# Patient Record
Sex: Female | Born: 1960 | Race: Black or African American | Hispanic: No | Marital: Single | Smoking: Current every day smoker
Health system: Southern US, Community
[De-identification: ages and names within clinical notes are randomized; demographics above are authoritative.]

## PROBLEM LIST (undated history)

## (undated) DIAGNOSIS — F32A Depression, unspecified: Secondary | ICD-10-CM

## (undated) DIAGNOSIS — R87619 Unspecified abnormal cytological findings in specimens from cervix uteri: Secondary | ICD-10-CM

## (undated) DIAGNOSIS — D219 Benign neoplasm of connective and other soft tissue, unspecified: Secondary | ICD-10-CM

## (undated) DIAGNOSIS — J45909 Unspecified asthma, uncomplicated: Secondary | ICD-10-CM

## (undated) DIAGNOSIS — F419 Anxiety disorder, unspecified: Secondary | ICD-10-CM

## (undated) DIAGNOSIS — M1990 Unspecified osteoarthritis, unspecified site: Secondary | ICD-10-CM

## (undated) DIAGNOSIS — A630 Anogenital (venereal) warts: Secondary | ICD-10-CM

## (undated) DIAGNOSIS — K219 Gastro-esophageal reflux disease without esophagitis: Secondary | ICD-10-CM

## (undated) DIAGNOSIS — A64 Unspecified sexually transmitted disease: Secondary | ICD-10-CM

## (undated) DIAGNOSIS — T7840XA Allergy, unspecified, initial encounter: Secondary | ICD-10-CM

## (undated) HISTORY — DX: Anogenital (venereal) warts: A63.0

## (undated) HISTORY — DX: Benign neoplasm of connective and other soft tissue, unspecified: D21.9

## (undated) HISTORY — DX: Unspecified osteoarthritis, unspecified site: M19.90

## (undated) HISTORY — DX: Unspecified sexually transmitted disease: A64

## (undated) HISTORY — DX: Depression, unspecified: F32.A

## (undated) HISTORY — DX: Anxiety disorder, unspecified: F41.9

## (undated) HISTORY — DX: Gastro-esophageal reflux disease without esophagitis: K21.9

## (undated) HISTORY — DX: Unspecified asthma, uncomplicated: J45.909

## (undated) HISTORY — PX: SALPINGECTOMY: SHX328

## (undated) HISTORY — DX: Unspecified abnormal cytological findings in specimens from cervix uteri: R87.619

## (undated) HISTORY — DX: Allergy, unspecified, initial encounter: T78.40XA

## (undated) HISTORY — PX: CERVIX LESION DESTRUCTION: SHX591

## (undated) HISTORY — PX: ABDOMINAL HYSTERECTOMY: SHX81

---

## 1997-10-14 ENCOUNTER — Emergency Department (HOSPITAL_COMMUNITY): Admission: EM | Admit: 1997-10-14 | Discharge: 1997-10-14 | Payer: Self-pay | Admitting: *Deleted

## 2000-05-05 ENCOUNTER — Encounter: Payer: Self-pay | Admitting: Family Medicine

## 2000-05-05 ENCOUNTER — Ambulatory Visit (HOSPITAL_COMMUNITY): Admission: RE | Admit: 2000-05-05 | Discharge: 2000-05-05 | Payer: Self-pay | Admitting: Family Medicine

## 2000-06-17 ENCOUNTER — Ambulatory Visit (HOSPITAL_COMMUNITY): Admission: RE | Admit: 2000-06-17 | Discharge: 2000-06-17 | Payer: Self-pay | Admitting: Family Medicine

## 2000-12-16 ENCOUNTER — Other Ambulatory Visit: Admission: RE | Admit: 2000-12-16 | Discharge: 2000-12-16 | Payer: Self-pay | Admitting: Obstetrics and Gynecology

## 2001-01-08 ENCOUNTER — Encounter (INDEPENDENT_AMBULATORY_CARE_PROVIDER_SITE_OTHER): Payer: Self-pay | Admitting: Specialist

## 2001-01-08 ENCOUNTER — Ambulatory Visit (HOSPITAL_BASED_OUTPATIENT_CLINIC_OR_DEPARTMENT_OTHER): Admission: RE | Admit: 2001-01-08 | Discharge: 2001-01-08 | Payer: Self-pay | Admitting: Obstetrics and Gynecology

## 2001-05-13 ENCOUNTER — Other Ambulatory Visit: Admission: RE | Admit: 2001-05-13 | Discharge: 2001-05-13 | Payer: Self-pay | Admitting: Gynecology

## 2001-05-13 ENCOUNTER — Encounter (INDEPENDENT_AMBULATORY_CARE_PROVIDER_SITE_OTHER): Payer: Self-pay | Admitting: *Deleted

## 2001-05-13 ENCOUNTER — Ambulatory Visit: Admission: RE | Admit: 2001-05-13 | Discharge: 2001-05-13 | Payer: Self-pay | Admitting: Gynecology

## 2001-07-07 ENCOUNTER — Encounter (INDEPENDENT_AMBULATORY_CARE_PROVIDER_SITE_OTHER): Payer: Self-pay | Admitting: Specialist

## 2001-07-07 ENCOUNTER — Ambulatory Visit (HOSPITAL_COMMUNITY): Admission: RE | Admit: 2001-07-07 | Discharge: 2001-07-07 | Payer: Self-pay | Admitting: Gynecology

## 2001-07-29 ENCOUNTER — Ambulatory Visit: Admission: RE | Admit: 2001-07-29 | Discharge: 2001-07-29 | Payer: Self-pay | Admitting: Gynecology

## 2001-09-02 ENCOUNTER — Ambulatory Visit: Admission: RE | Admit: 2001-09-02 | Discharge: 2001-09-02 | Payer: Self-pay | Admitting: Gynecology

## 2003-05-13 ENCOUNTER — Ambulatory Visit (HOSPITAL_COMMUNITY): Admission: RE | Admit: 2003-05-13 | Discharge: 2003-05-13 | Payer: Self-pay | Admitting: Gynecology

## 2004-10-19 ENCOUNTER — Ambulatory Visit: Payer: Self-pay | Admitting: Family Medicine

## 2004-12-13 ENCOUNTER — Ambulatory Visit: Payer: Self-pay | Admitting: Family Medicine

## 2004-12-17 ENCOUNTER — Ambulatory Visit: Payer: Self-pay | Admitting: *Deleted

## 2005-01-16 ENCOUNTER — Ambulatory Visit: Payer: Self-pay | Admitting: Family Medicine

## 2005-02-18 ENCOUNTER — Ambulatory Visit: Payer: Self-pay | Admitting: Nurse Practitioner

## 2005-05-13 ENCOUNTER — Ambulatory Visit: Payer: Self-pay | Admitting: Family Medicine

## 2005-05-13 ENCOUNTER — Encounter (INDEPENDENT_AMBULATORY_CARE_PROVIDER_SITE_OTHER): Payer: Self-pay | Admitting: Family Medicine

## 2005-05-20 ENCOUNTER — Ambulatory Visit (HOSPITAL_COMMUNITY): Admission: RE | Admit: 2005-05-20 | Discharge: 2005-05-20 | Payer: Self-pay | Admitting: Family Medicine

## 2005-09-13 ENCOUNTER — Ambulatory Visit: Payer: Self-pay | Admitting: Family Medicine

## 2005-12-13 ENCOUNTER — Ambulatory Visit: Payer: Self-pay | Admitting: Nurse Practitioner

## 2006-10-07 ENCOUNTER — Ambulatory Visit: Payer: Self-pay | Admitting: Internal Medicine

## 2006-10-15 ENCOUNTER — Encounter (INDEPENDENT_AMBULATORY_CARE_PROVIDER_SITE_OTHER): Payer: Self-pay | Admitting: *Deleted

## 2006-11-03 DIAGNOSIS — J309 Allergic rhinitis, unspecified: Secondary | ICD-10-CM

## 2006-11-03 DIAGNOSIS — E669 Obesity, unspecified: Secondary | ICD-10-CM

## 2006-11-03 DIAGNOSIS — R1013 Epigastric pain: Secondary | ICD-10-CM

## 2006-11-03 DIAGNOSIS — C519 Malignant neoplasm of vulva, unspecified: Secondary | ICD-10-CM | POA: Insufficient documentation

## 2006-11-03 DIAGNOSIS — E66813 Obesity, class 3: Secondary | ICD-10-CM

## 2006-11-03 DIAGNOSIS — A630 Anogenital (venereal) warts: Secondary | ICD-10-CM

## 2006-11-03 DIAGNOSIS — K3189 Other diseases of stomach and duodenum: Secondary | ICD-10-CM | POA: Insufficient documentation

## 2006-11-03 DIAGNOSIS — F17200 Nicotine dependence, unspecified, uncomplicated: Secondary | ICD-10-CM

## 2006-11-03 DIAGNOSIS — K279 Peptic ulcer, site unspecified, unspecified as acute or chronic, without hemorrhage or perforation: Secondary | ICD-10-CM

## 2006-11-03 DIAGNOSIS — Z720 Tobacco use: Secondary | ICD-10-CM

## 2006-11-03 HISTORY — DX: Other diseases of stomach and duodenum: R10.13

## 2006-11-03 HISTORY — DX: Allergic rhinitis, unspecified: J30.9

## 2006-11-03 HISTORY — DX: Obesity, class 3: E66.813

## 2006-11-03 HISTORY — DX: Peptic ulcer, site unspecified, unspecified as acute or chronic, without hemorrhage or perforation: K27.9

## 2006-11-03 HISTORY — DX: Morbid (severe) obesity due to excess calories: E66.01

## 2006-11-03 HISTORY — DX: Tobacco use: Z72.0

## 2006-11-03 HISTORY — DX: Malignant neoplasm of vulva, unspecified: C51.9

## 2006-11-03 HISTORY — DX: Other diseases of stomach and duodenum: K31.89

## 2006-11-03 HISTORY — DX: Anogenital (venereal) warts: A63.0

## 2006-11-10 ENCOUNTER — Encounter (INDEPENDENT_AMBULATORY_CARE_PROVIDER_SITE_OTHER): Payer: Self-pay | Admitting: Family Medicine

## 2006-11-10 ENCOUNTER — Ambulatory Visit: Payer: Self-pay | Admitting: Family Medicine

## 2006-11-10 LAB — CONVERTED CEMR LAB
LDL Cholesterol: 93 mg/dL (ref 0–99)
Total CHOL/HDL Ratio: 2.8

## 2007-02-11 ENCOUNTER — Ambulatory Visit: Payer: Self-pay | Admitting: Internal Medicine

## 2007-05-18 ENCOUNTER — Ambulatory Visit: Payer: Self-pay | Admitting: Internal Medicine

## 2007-07-14 ENCOUNTER — Ambulatory Visit: Payer: Self-pay | Admitting: Family Medicine

## 2007-08-18 ENCOUNTER — Encounter: Admission: RE | Admit: 2007-08-18 | Discharge: 2007-08-18 | Payer: Self-pay | Admitting: General Surgery

## 2007-09-03 ENCOUNTER — Ambulatory Visit (HOSPITAL_COMMUNITY): Admission: RE | Admit: 2007-09-03 | Discharge: 2007-09-03 | Payer: Self-pay | Admitting: General Surgery

## 2007-09-03 ENCOUNTER — Encounter (INDEPENDENT_AMBULATORY_CARE_PROVIDER_SITE_OTHER): Payer: Self-pay | Admitting: General Surgery

## 2007-09-14 ENCOUNTER — Ambulatory Visit: Payer: Self-pay | Admitting: Internal Medicine

## 2007-10-02 ENCOUNTER — Emergency Department (HOSPITAL_COMMUNITY): Admission: EM | Admit: 2007-10-02 | Discharge: 2007-10-03 | Payer: Self-pay | Admitting: Emergency Medicine

## 2007-10-06 ENCOUNTER — Ambulatory Visit: Payer: Self-pay | Admitting: Internal Medicine

## 2009-02-20 ENCOUNTER — Inpatient Hospital Stay (HOSPITAL_COMMUNITY): Admission: EM | Admit: 2009-02-20 | Discharge: 2009-02-25 | Payer: Self-pay | Admitting: Emergency Medicine

## 2009-03-02 ENCOUNTER — Emergency Department (HOSPITAL_COMMUNITY): Admission: EM | Admit: 2009-03-02 | Discharge: 2009-03-02 | Payer: Self-pay | Admitting: Emergency Medicine

## 2009-07-05 ENCOUNTER — Ambulatory Visit: Payer: Self-pay | Admitting: Family Medicine

## 2010-04-15 LAB — BASIC METABOLIC PANEL
CO2: 27 mEq/L (ref 19–32)
Calcium: 9 mg/dL (ref 8.4–10.5)
GFR calc Af Amer: >60 mL/min (ref >60)
GFR calc non Af Amer: >60 mL/min (ref >60)
Glucose, Bld: 160 mg/dL — ABNORMAL HIGH (ref 70–99)
Potassium: 3.8 mEq/L (ref 3.5–5.1)
Sodium: 131 mEq/L — ABNORMAL LOW (ref 135–145)
Sodium: 141 mEq/L (ref 135–145)

## 2010-04-15 LAB — CBC
HCT: 41.4 % (ref 36.0–46.0)
Hemoglobin: 14.4 g/dL (ref 12.0–15.0)
Hemoglobin: 14.9 g/dL (ref 12.0–15.0)
Hemoglobin: 15.9 g/dL — ABNORMAL HIGH (ref 12.0–15.0)
MCHC: 33.7 g/dL (ref 30.0–36.0)
MCHC: 34 g/dL (ref 30.0–36.0)
MCHC: 34.4 g/dL (ref 30.0–36.0)
MCV: 88.9 fL (ref 78.0–100.0)
MCV: 91.1 fL (ref 78.0–100.0)
Platelets: 150 10*3/uL (ref 150–400)
Platelets: 150 10*3/uL (ref 150–400)
Platelets: 152 10*3/uL (ref 150–400)
RBC: 4.55 MIL/uL (ref 3.87–5.11)
RBC: 4.74 MIL/uL (ref 3.87–5.11)
RBC: 4.89 MIL/uL (ref 3.87–5.11)
RBC: 5.25 MIL/uL — ABNORMAL HIGH (ref 3.87–5.11)
RDW: 13.9 % (ref 11.5–15.5)
RDW: 13.9 % (ref 11.5–15.5)
RDW: 14 % (ref 11.5–15.5)
WBC: 11.3 10*3/uL — ABNORMAL HIGH (ref 4.0–10.5)
WBC: 12.2 10*3/uL — ABNORMAL HIGH (ref 4.0–10.5)
WBC: 13.1 10*3/uL — ABNORMAL HIGH (ref 4.0–10.5)
WBC: 14.3 10*3/uL — ABNORMAL HIGH (ref 4.0–10.5)

## 2010-04-15 LAB — DIFFERENTIAL
Basophils Relative: 0 % (ref 0–1)
Basophils Relative: 0 % (ref 0–1)
Lymphocytes Relative: 7 % — ABNORMAL LOW (ref 12–46)
Lymphs Abs: 0.8 10*3/uL (ref 0.7–4.0)
Lymphs Abs: 0.8 10*3/uL (ref 0.7–4.0)
Monocytes Absolute: 0.6 10*3/uL (ref 0.1–1.0)
Monocytes Relative: 3 % (ref 3–12)
Neutrophils Relative %: 92 % — ABNORMAL HIGH (ref 43–77)

## 2010-04-15 LAB — BLOOD GAS, ARTERIAL
Acid-Base Excess: 0.6 mmol/L (ref 0.0–2.0)
Bicarbonate: 25.3 mEq/L — ABNORMAL HIGH (ref 20.0–24.0)
FIO2: 0.32 %
O2 Saturation: 93.5 %
Patient temperature: 98.6
TCO2: 26.6 mmol/L (ref 0–100)
pH, Arterial: 7.367 (ref 7.350–7.400)

## 2010-04-15 LAB — POCT I-STAT, CHEM 8
BUN: 7 mg/dL (ref 6–23)
Calcium, Ion: 1.01 mmol/L — ABNORMAL LOW (ref 1.12–1.32)
Chloride: 105 mEq/L (ref 96–112)
Creatinine, Ser: 0.9 mg/dL (ref 0.4–1.2)
Glucose, Bld: 107 mg/dL — ABNORMAL HIGH (ref 70–99)
Hemoglobin: 17 g/dL — ABNORMAL HIGH (ref 12.0–15.0)
Potassium: 3.6 mEq/L (ref 3.5–5.1)
Sodium: 134 mEq/L — ABNORMAL LOW (ref 135–145)
TCO2: 23 mmol/L (ref 0–100)

## 2010-04-15 LAB — LEGIONELLA ANTIGEN, URINE

## 2010-04-15 LAB — POCT I-STAT 3, ART BLOOD GAS (G3+): pH, Arterial: 7.379 (ref 7.350–7.400)

## 2010-04-18 LAB — CBC
HCT: 42.8 % (ref 36.0–46.0)
MCV: 91.4 fL (ref 78.0–100.0)
RBC: 4.68 MIL/uL (ref 3.87–5.11)

## 2010-04-18 LAB — CULTURE, BLOOD (ROUTINE X 2)
Culture: NO GROWTH
Culture: NO GROWTH

## 2010-04-18 LAB — DIFFERENTIAL
Lymphocytes Relative: 21 % (ref 12–46)
Monocytes Absolute: 2 10*3/uL — ABNORMAL HIGH (ref 0.1–1.0)
Monocytes Relative: 7 % (ref 3–12)
Neutrophils Relative %: 72 % (ref 43–77)

## 2010-04-18 LAB — BASIC METABOLIC PANEL
Calcium: 8.9 mg/dL (ref 8.4–10.5)
Chloride: 104 mEq/L (ref 96–112)
Creatinine, Ser: 0.67 mg/dL (ref 0.4–1.2)
GFR calc Af Amer: >60 mL/min (ref >60)
Glucose, Bld: 120 mg/dL — ABNORMAL HIGH (ref 70–99)
Potassium: 3.4 mEq/L — ABNORMAL LOW (ref 3.5–5.1)

## 2010-06-12 NOTE — Op Note (Signed)
NAME:  Sandra Stafford, Sandra Stafford NO.:  [id number]   MEDICAL RECORD NO.:  [id number]          PATIENT TYPE:  AMB   LOCATION:  SDS                          FACILITY:  [facility]   PHYSICIAN:  Juanetta Gosling, MDDATE OF BIRTH:  05-20-1960   DATE OF PROCEDURE:  09/03/2007  DATE OF DISCHARGE:  09/03/2007                               OPERATIVE REPORT   PREOPERATIVE DIAGNOSIS:  Back mass.   POSTOPERATIVE DIAGNOSIS:  Back lipoma.   PROCEDURE PERFORMED:  Excisional biopsy of back mass with placement of  Blake drain.   SURGEON:  Juanetta Gosling, MD.   ASSISTANT:  None.   ANESTHESIA:  General endotracheal.   COMPLICATIONS:  None.   DRAINS:  JP drain left in place.   ESTIMATED BLOOD LOSS:  Minimal.   SPECIMENS:  Back mass to pathology.   INDICATIONS:  This is a 50 year old female with a longstanding history  of an increasing in size back mass that on examination appeared to be  consistent with a very large lipoma.  She was scheduled for an  excisional biopsy of this area.   PROCEDURE:  After informed consent was obtained, the patient was  administered 1 g of intravenous Ancef and then taken to the operating  room where she was placed under general endotracheal anesthesia without  complication.  She was then rolled into the prone position and  appropriately padded.  Her back was then prepped and draped in the  standard sterile surgical fashion.  A vertical incision was made  overlying the mass.  Flaps were developed in both directions and carried  out around the mass which was then carried out down to the level of the  muscle.  The mass was then removed in total.  Hemostasis was observed in  the bed.  A 10-mm Blake drain was then placed in through a  separate stab incision.  A layered closure using 2-0 Vicryl to close the  dermis and #1 nylon to close the skin and a mattress suture was then  used.  Bacitracin was placed over the wound and sterile dressing was  placed.  She was rolled supine, extubated in the operating room, and  transferred to PACU in stable condition.      Juanetta Gosling, MD  Electronically Signed     MCW/MEDQ  D:  09/03/2007  T:  09/04/2007  Job:  [id number]

## 2010-06-15 NOTE — Op Note (Signed)
[facility]  Patient:    Sandra Stafford, Sandra Stafford Visit Number: [id number] MRN: [id number]          Service Type: DSU Location: DAY Attending Physician:  Jeannette Corpus Dictated by:   Rande Brunt. Clarke-Pearson, M.D. Proc. Date: 07/07/01 Admit Date:  07/07/2001 Discharge Date: 07/07/2001   CC:         Telford Nab, R.N.   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  PROCEDURE:  Partial simple vulvectomy.  SURGEON:  Daniel L. Clarke-Pearson, M.D.  ASSISTANT:  Telford Nab, R.N.  ANESTHESIA:  General with oral tracheal tube.  ESTIMATED BLOOD LOSS:  50 cc.  SURGICAL FINDINGS:  Examination under anesthesia revealed hyperpigmented lesions in the posterior vulva on both the right and left sides. In addition after staining with acetic acid, hyperpigmented and acetowhite lesions were noted on the redundant left labia minora.  DESCRIPTION OF PROCEDURE:  The patient was brought to the operating room and after satisfactory attainment of general anesthesia was placed in the lithotomy position in candy cane stirrups. The perineum and vagina were prepped with Betadine and the patient was draped. Acetic acid was applied to the vulva for approximately five minutes and then the vulva was reinspected. There were prominent lesions on both the right and left posterior vulva in addition involving the left labia minora. Three separate specimens were removed with the 5 mm margin away from gross disease.  These were submitted as separate specimens.  The hemostasis was achieved with cautery. The skin was then reapproximated with interrupted sutures of 3-0 Vicryl. The patient had a good cosmetic result at the completion of the procedure. Sponge, needle and instrument counts were correct x2. Dictated by:   Rande Brunt. Clarke-Pearson, M.D. Attending  Physician:  Jeannette Corpus DD:  07/07/01 TD:  07/09/01 Job: [id number]

## 2010-06-15 NOTE — Consult Note (Signed)
[facility]  Patient:    Sandra Stafford, Sandra Stafford Visit Number: [id number] MRN: [id number]          Service Type: GON Location: GYN Attending Physician:  Jeannette Corpus Dictated by:   Rande Brunt. Clarke-Pearson, M.D. Proc. Date: 07/29/01 Admit Date:  07/29/2001 Discharge Date: 07/29/2001   CC:         Telford Nab, R.N.   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  She initially had an uncomplicated postoperative course but became physically active and now has some drainage from the surgical site.  Final pathology showed all margins were free of disease and there was no invasion noted.  PHYSICAL EXAMINATION:  VITAL SIGNS:  Weight 254 pounds.  Blood pressure 136/86.  PELVIC:  EGBUS show that the vulvectomy incision has separated; it is granulating well.  No evidence of infection is noted.  IMPRESSION:  Wound separation following simple vulvectomy.  PLAN:  The patient will begin Sitz baths three times a day, apply Silvadene cream after her Sitz baths.  She will return to see me in four weeks or as needed.  Pathology report was discussed with the patient. Dictated by:   Rande Brunt. Clarke-Pearson, M.D. Attending Physician:  Jeannette Corpus DD:  07/29/01 TD:  08/01/01 Job: [id number]

## 2010-06-15 NOTE — Consult Note (Signed)
[facility]  Patient:    Sandra Stafford, Sandra Stafford Visit Number: [id number] MRN: [id number]          Service Type: GON Location: GYN Attending Physician:  Jeannette Corpus Dictated by:   Rande Brunt. Clarke-Pearson, M.D. Proc. Date: 05/13/01 Admit Date:  05/13/2001   CC:         Katherine Roan, M.D.  Telford Nab, R.N.   Consultation Report  HISTORY OF PRESENT ILLNESS:  A 50 year old African-American female seen in consultation at the request of Dr. Kyra Manges.  The patient apparently has had vulvar "warts" for several years.  She saw Dr. Elana Alm in November 2000, and he undertook a partial simple vulvectomy of her obvious lesions.  These returned showing the patient had carcinoma in situ. Subsequently she has healed and has had a recurrence biopsied by Dr. Elana Alm on February 20 showing persistent squamous cell carcinoma in situ.  It is recalled that in her excisional biopsies in December there was one area of microinvasion (0.1 mm).  The patient gives a past history of having a hysterectomy for abnormal Pap smears approximately 10 years ago.  She claims that she prior to that had had a number of problems with her Pap smears requiring conization and laser procedures.  MEDICATIONS:  Zyrtec, Rhinocort, amoxicillin, and Rynatan.  DRUG ALLERGIES:  None.  PAST SURGICAL HISTORY:  Cesarean section and abdominal hysterectomy.  SOCIAL HISTORY:  The patient is unmarried.  She is raising a 59-year-old niece. She smokes one pack per day.  REVIEW OF SYSTEMS:  Otherwise negative.  FAMILY HISTORY:  Likewise negative for gynecologic, breast, or colon cancers.  PHYSICAL EXAMINATION:  VITAL SIGNS:  Weight 252 pounds, blood pressure 120/84, pulse 100, respiratory rate 20.  GENERAL:  The patient is a pleasant but anxious black female in no acute distress.  HEENT:  Negative.  NECK:  Supple without thyromegaly.  LYMPHATIC:  No supraclavicular or  inguinal adenopathy.  ABDOMEN:  Obese, soft, nontender, no mass, organomegaly, ascites, or hernias are noted.  PELVIC:  EG, BUS shows an area of raised, hyperpigmented epithelium lateral to the right labia minora.  There also seems to be some involvement of the left labia minora.  The vagina is otherwise clean, healthy, and no lesions are noted, and well-supported.  Bimanual and rectovaginal exam reveal no mass, induration, or nodularity.  PROCEDURE NOTE:  After applying acetic acid for approximately four to five minutes, the vulva is colposcoped.  The area of obvious disease persists, but there is now more apparent disease along nearly the entire left labia minora and a portion of the right labia minora.  There are no lesions around the anus.  IMPRESSION:  Carcinoma in situ of the vulva.  I had a length discussion with the patient regarding management options, including the potential for laser vaporization, wide local excision with primary closure, or excision with skin grafting.  In fact, I do not believe skin grafting would be appropriate in this setting given the relatively limited nature of her disease.  All of this discussed with the patient.  She would like to proceed with outpatient wide local excision. Dictated by:   Rande Brunt. Clarke-Pearson, M.D. Attending Physician:  Jeannette Corpus DD:  05/13/01 TD:  05/14/01 Job: [id number]

## 2010-06-15 NOTE — Consult Note (Signed)
   NAME:  Sandra Stafford, Sandra Stafford NO.:  [id number]   MEDICAL RECORD NO.:  [id number]                   PATIENT TYPE:  OUT   LOCATION:  GYN                                  FACILITY:  [facility]   PHYSICIAN:  Rande Brunt. Clarke-Pearson, M.D.      DATE OF BIRTH:  08/20/1960   DATE OF CONSULTATION:  DATE OF DISCHARGE:  09/02/2001                                 GYN CONSULTATION   HISTORY OF PRESENT ILLNESS:  This 50 year old African-American female  returns for continuing follow-up, having had a partial vulvectomy on July 07, 2001, for multifocal squamous cell carcinoma of the in situ of the  vulva.  She reports that she has done well since her last visit, with good  wound healing.  She is fully active and is starting to seek employment.  She  specifically denies any GI or GU symptoms.  There is no pelvic pain,  pressure, vaginal edema, or discharge.   PHYSICAL EXAMINATION:  VITAL SIGNS:  Weight 259 pounds.  Blood pressure  110/80.  GENERAL:  Obese black female in no acute distress.  HEENT:  Negative.  NECK:  Supple.  Without thyromegaly.  There is no supraclavicular or  inguinal adenopathy.  ABDOMEN:  Soft and nontender.  No masses, organomegaly, ascites, or hernias  are noted.  PELVIC:  EGBUS shows that the partial vulvectomy site has healed well.  There is no remaining healing to be accomplished.  There are no new lesions.  The vagina is otherwise clean.   IMPRESSION:  Carcinoma in situ of vulva, status post simple partial  vulvectomy.  This shows a good wound healing.   PLAN:  At this juncture I would recommend that she be examined from a  gynecologic point of view every six months.  She indicates she would like to  return to [facility] for her gynecologic exams, and this is fine with me.  I indicated I would be happy to see her in the future if necessary.                                               Daniel L. Clarke-Pearson, M.D.    DLC/MEDQ  D:   09/02/2001  T:  09/05/2001  Job:  [id number]   cc:   Maurice March, M.D.   Katherine Roan, M.D.   Telford Nab, R.N.

## 2010-06-15 NOTE — Op Note (Signed)
[facility]  Patient:    Sandra Stafford, DOTEN Visit Number: [id number] MRN: [id number]          Service Type: NES Location: NESC Attending Physician:  Lendon Colonel Dictated by:   Kathie Rhodes. Kyra Manges, M.D. Proc. 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  OPERATION:  Bilateral partial vulvectomies.  DESCRIPTION OF PROCEDURE:  The patient was placed in the lithotomy position and prepped and draped in the usual fashion.  The lesions on the left side of the vulva were two, and the lesions on the right side were likewise two.  They were infiltrated with 05% Marcaine with epinephrine, following which I excised these lesions using the Bovie for hemostasis.  The lesions were marked #1, 2, 3, and 4.  The first two lesions were on the left side, and the other 3 and 4 were from the right side.  They were pinned on cork.  The skin was then closed with a subcuticular 3-0 Vicryl and 4-0 PDS.  Silvadene cream was used to paint the entire perineum.  There are multiple small nodules perirectally which I cauterized with the Bovie.  Ms. Gosser tolerated this procedure well and was sent to the recovery room in good condition. Dictated by:   S. Kyra Manges, M.D. Attending Physician:  Lendon Colonel DD:  01/08/01 TD:  01/08/01 Job: [id number]

## 2010-10-26 LAB — DIFFERENTIAL
Basophils Absolute: 0
Eosinophils Absolute: 0.2
Eosinophils Relative: 2
Lymphocytes Relative: 25
Neutrophils Relative %: 69

## 2010-10-26 LAB — COMPREHENSIVE METABOLIC PANEL
AST: 19
CO2: 25
Chloride: 105
Creatinine, Ser: 0.87
GFR calc Af Amer: >60
GFR calc non Af Amer: >60
Glucose, Bld: 104 — ABNORMAL HIGH
Total Bilirubin: 0.5

## 2010-10-26 LAB — CBC
HCT: 47 — ABNORMAL HIGH
Hemoglobin: 15.9 — ABNORMAL HIGH
MCHC: 33.9
MCV: 90.2
RBC: 5.21 — ABNORMAL HIGH
WBC: 11.4 — ABNORMAL HIGH

## 2010-11-27 IMAGING — CR DG CHEST 2V
2 series · 2 of 2 positions shown · non-contrast
Comparison: 02/20/2009

CLINICAL DATA: Shortness of breath.

CHEST - 2 VIEW

[w chest pa]
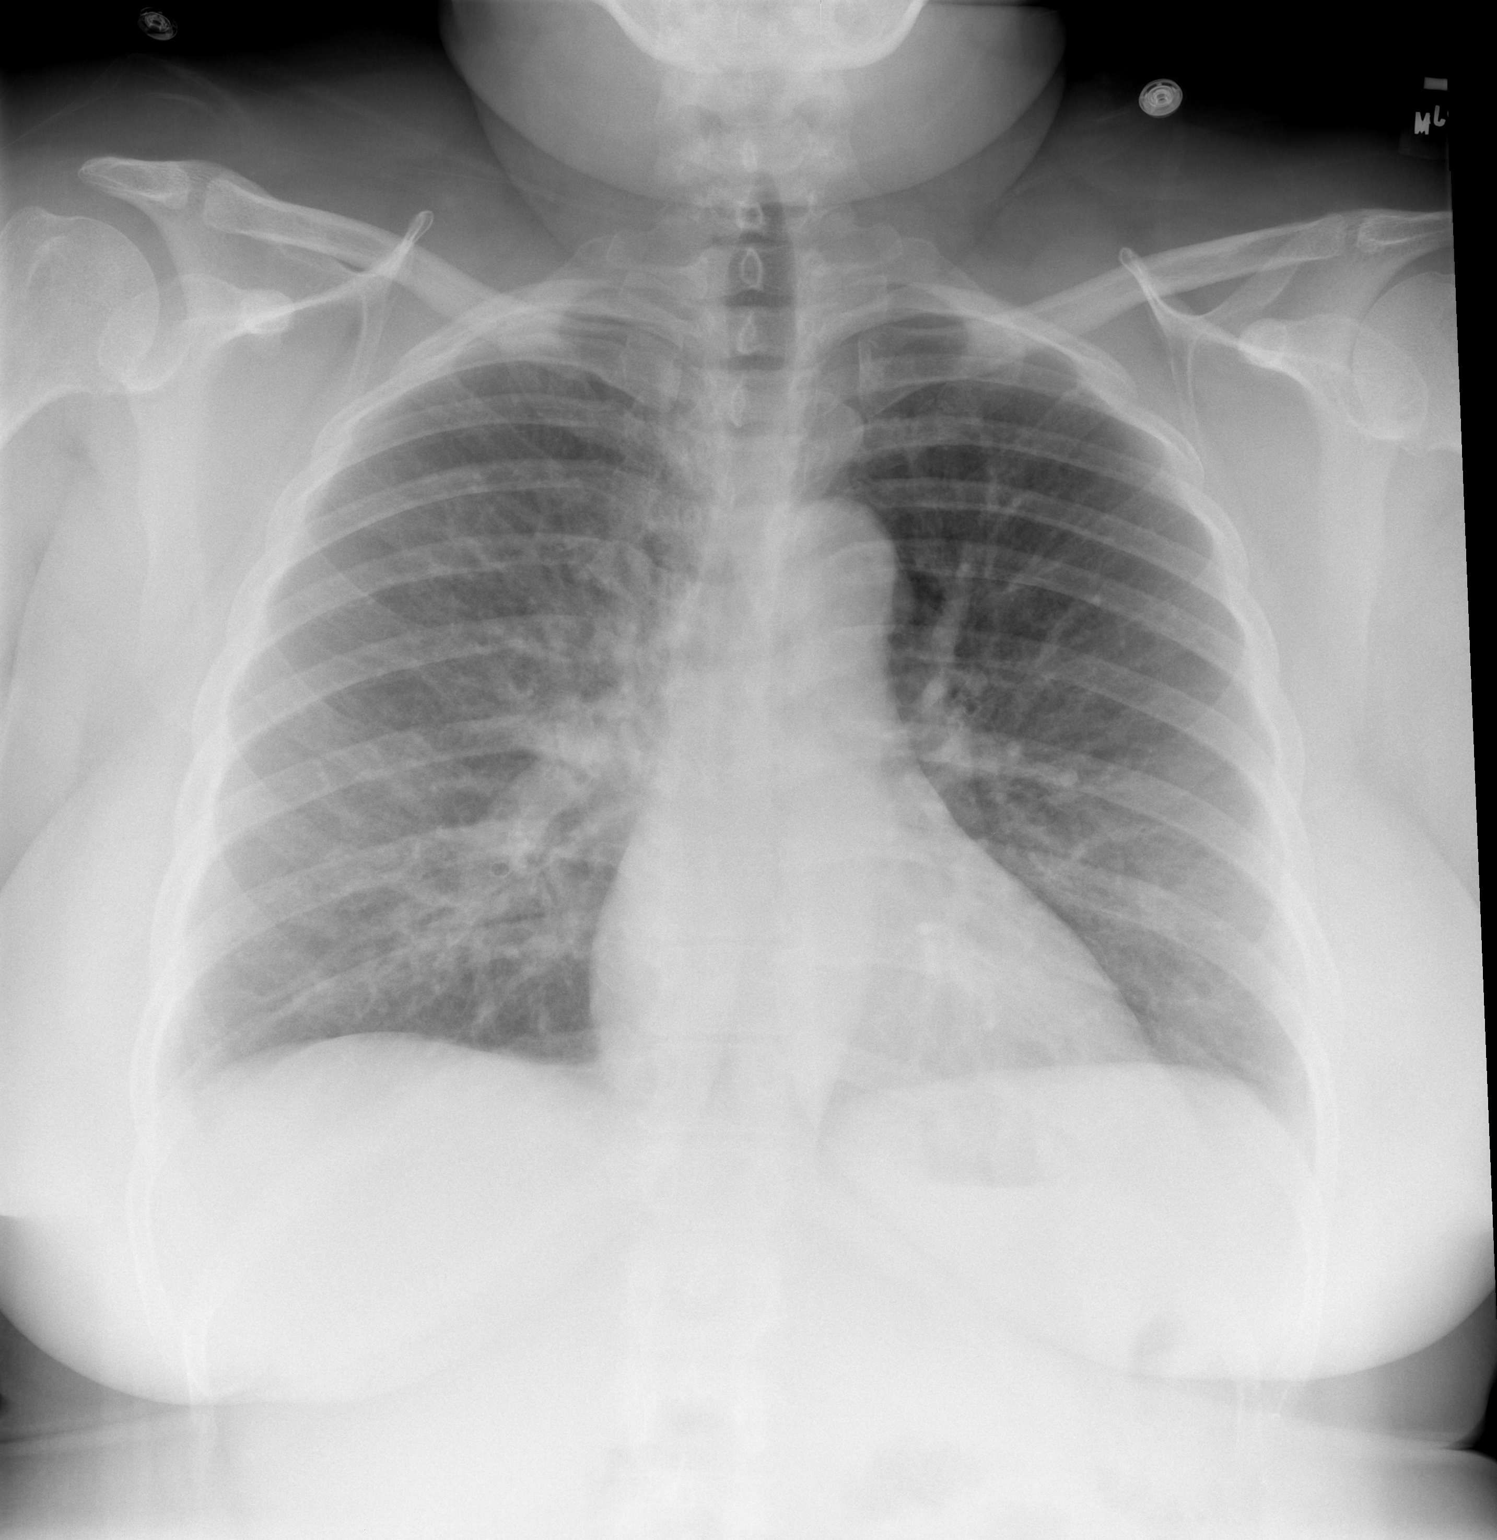

[w chest lat]
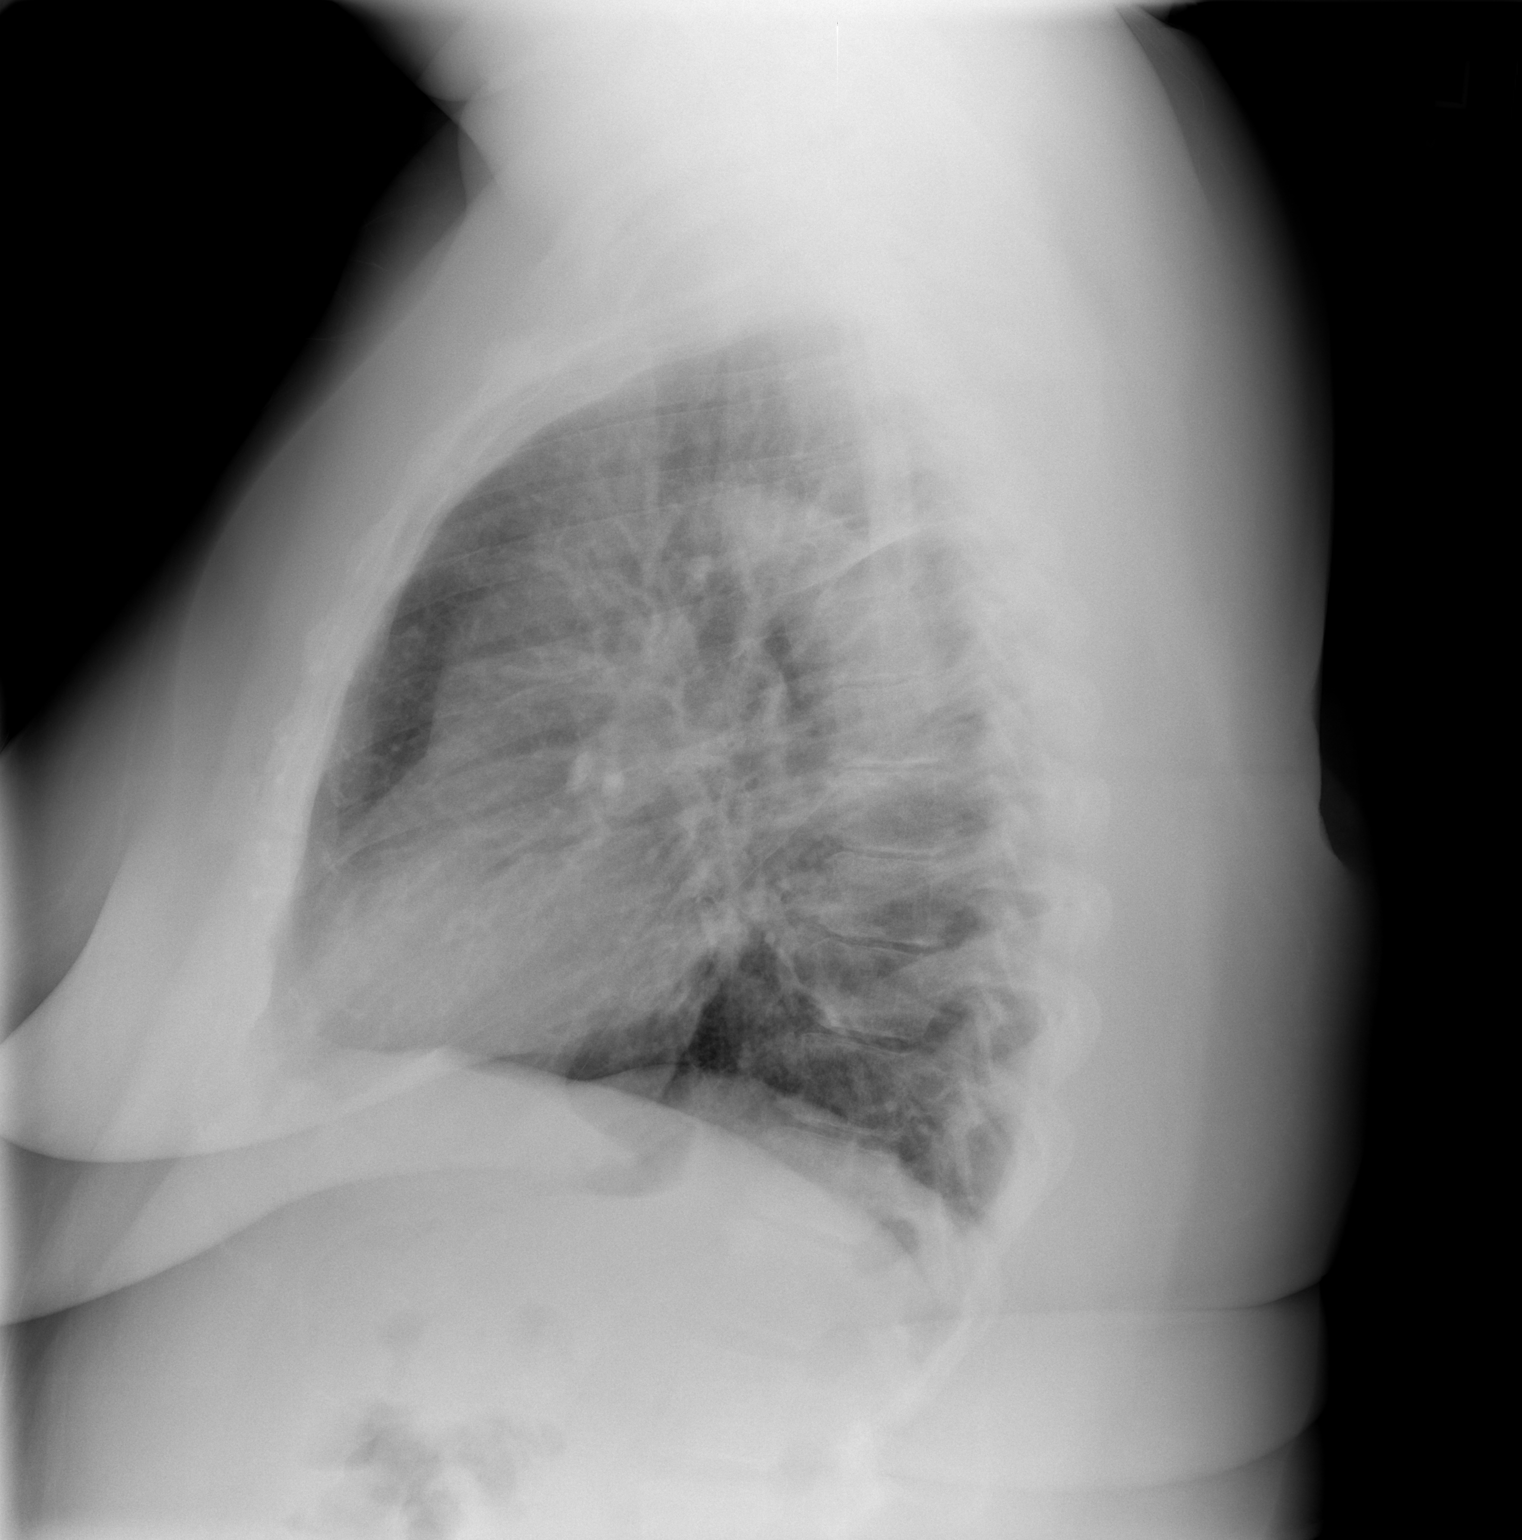

[2 of 2 positions shown; findings below may reference images not displayed]

FINDINGS: Peribronchial thickening, similar to prior study. Heart
and mediastinal contours are within normal limits.  No focal
opacities or effusions.  No acute bony abnormality.
IMPRESSION: Stable bronchitic changes.

## 2014-11-11 ENCOUNTER — Ambulatory Visit (INDEPENDENT_AMBULATORY_CARE_PROVIDER_SITE_OTHER): Payer: No Typology Code available for payment source | Admitting: Physician Assistant

## 2014-11-11 VITALS — BP 120/84 | HR 81 | Temp 98.9°F | Resp 18 | Ht 65.0 in | Wt 240.0 lb

## 2014-11-11 DIAGNOSIS — J309 Allergic rhinitis, unspecified: Secondary | ICD-10-CM

## 2014-11-11 DIAGNOSIS — J0100 Acute maxillary sinusitis, unspecified: Secondary | ICD-10-CM

## 2014-11-11 DIAGNOSIS — R062 Wheezing: Secondary | ICD-10-CM | POA: Diagnosis not present

## 2014-11-11 MED ORDER — GUAIFENESIN ER 1200 MG PO TB12: 1.0000 | ORAL_TABLET | Freq: Two times a day (BID) | ORAL | Status: AC

## 2014-11-11 MED ORDER — AMOXICILLIN 875 MG PO TABS: 875.0000 mg | ORAL_TABLET | Freq: Two times a day (BID) | ORAL | Status: DC

## 2014-11-11 MED ORDER — FLUTICASONE PROPIONATE 50 MCG/ACT NA SUSP: 2.0000 | Freq: Every day | NASAL | Status: DC

## 2014-11-11 MED ORDER — ALBUTEROL SULFATE HFA 108 (90 BASE) MCG/ACT IN AERS: 2.0000 | INHALATION_SPRAY | Freq: Four times a day (QID) | RESPIRATORY_TRACT | Status: DC | PRN

## 2014-11-11 NOTE — Patient Instructions (Signed)
Please push fluids.  Tylenol and Motrin for fever and body aches.    

## 2014-11-11 NOTE — Progress Notes (Signed)
   Sandra Stafford  MRN: [id number] DOB: 1960-03-06  Subjective:  Pt presents to clinic with several weeks with cold symptoms.  She thought that she was getting better until this week.  3 days ago symptoms started to get worse with increase cough.  Significant congestion.  Home treatment - cold preps  Patient Active Problem List   Diagnosis Date Noted  . VENEREAL WART 11/03/2006  . 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 OBESITY NOS 11/03/2006  . TOBACCO USER 11/03/2006  . ALLERGIC RHINITIS 11/03/2006  . PUD 11/03/2006  . DYSPEPSIA 11/03/2006    No current outpatient prescriptions on file prior to visit.   No current facility-administered medications on file prior to visit.    Allergies  Allergen Reactions  . Dust Mite Mixed Allergen Ext [Mite (D. Farinae)]   . Pollen Extract     Review of Systems  Constitutional: Positive for fever (subjective) and chills.  HENT: Positive for congestion, postnasal drip, rhinorrhea (clear), sinus pressure (maxilarry pain) and sore throat (resolved from last week).   Respiratory: Positive for cough and wheezing. Negative for shortness of breath.        Allergic related asthma, smoker - 1/2 ppd  Gastrointestinal: Negative.   Musculoskeletal: Negative for myalgias.  Allergic/Immunologic: Positive for environmental allergies.  Neurological: Positive for headaches.   Objective:  BP 120/84 mmHg  Pulse 81  Temp(Src) 98.9 F (37.2 C) (Oral)  Resp 18  Ht 5\' 5"  (1.651 m)  Wt 240 lb (108.863 kg)  BMI 39.94 kg/m2  SpO2 98%  Physical Exam  Constitutional: She is oriented to person, place, and time and well-developed, well-nourished, and in no distress.  HENT:  Head: Normocephalic and atraumatic.  Right Ear: Hearing, tympanic membrane, external ear and ear canal normal.  Left Ear: Hearing, tympanic membrane, external ear and ear canal normal.  Nose: Mucosal edema (red and swollen) present.  Mouth/Throat: Uvula is midline, oropharynx is clear and moist  and mucous membranes are normal.  Eyes: Conjunctivae are normal.  Neck: Normal range of motion.  Cardiovascular: Normal rate, regular rhythm and normal heart sounds.   No murmur heard. Pulmonary/Chest: Effort normal and breath sounds normal.  Neurological: She is alert and oriented to person, place, and time. Gait normal.  Skin: Skin is warm and dry.  Psychiatric: Mood, memory, affect and judgment normal.  Vitals reviewed.   Assessment and Plan :  Allergic rhinitis, unspecified allergic rhinitis type - Plan: fluticasone (FLONASE) 50 MCG/ACT nasal spray  Acute maxillary sinusitis, recurrence not specified - Plan: Guaifenesin (MUCINEX MAXIMUM STRENGTH) 1200 MG TB12, amoxicillin (AMOXIL) 875 MG tablet  Wheezing - Plan: albuterol (PROVENTIL HFA;VENTOLIN HFA) 108 (90 BASE) MCG/ACT inhaler  Pt will restart the Flonase that has helped her in the past - she has under-treated allergies which has lead to a sinus infection.  We will treat the infection today and she will continue her flonase to prevent flairs of her allergies and prevent future sinus infections.  She has not had a CPE in years and will plan on scheduling an appt for that.  Windell Hummingbird PA-C  [facility] 11/11/2014 11:35 AM

## 2016-05-19 ENCOUNTER — Encounter (HOSPITAL_COMMUNITY): Payer: Self-pay | Admitting: Emergency Medicine

## 2016-05-19 ENCOUNTER — Emergency Department (HOSPITAL_COMMUNITY)
Admission: EM | Admit: 2016-05-19 | Discharge: 2016-05-19 | Disposition: A | Discharge status: Home / Self Care | Payer: Self-pay | Attending: Emergency Medicine | Admitting: Emergency Medicine

## 2016-05-19 DIAGNOSIS — J45909 Unspecified asthma, uncomplicated: Secondary | ICD-10-CM | POA: Insufficient documentation

## 2016-05-19 DIAGNOSIS — M542 Cervicalgia: Secondary | ICD-10-CM | POA: Insufficient documentation

## 2016-05-19 DIAGNOSIS — M79601 Pain in right arm: Secondary | ICD-10-CM | POA: Insufficient documentation

## 2016-05-19 DIAGNOSIS — Z79899 Other long term (current) drug therapy: Secondary | ICD-10-CM | POA: Insufficient documentation

## 2016-05-19 DIAGNOSIS — F17210 Nicotine dependence, cigarettes, uncomplicated: Secondary | ICD-10-CM | POA: Insufficient documentation

## 2016-05-19 MED ORDER — HYDROCODONE-ACETAMINOPHEN 5-325 MG PO TABS: 1.0000 | ORAL_TABLET | Freq: Four times a day (QID) | ORAL | 0 refills | Status: DC | PRN

## 2016-05-19 MED ORDER — PREDNISONE 20 MG PO TABS: ORAL_TABLET | ORAL | 0 refills | Status: DC

## 2016-05-19 MED ORDER — PANTOPRAZOLE SODIUM 20 MG PO TBEC
20.0000 mg | DELAYED_RELEASE_TABLET | Freq: Every day | ORAL | 0 refills | Status: DC
Start: 2016-05-19 — End: 2019-09-20

## 2016-05-19 MED ORDER — MELOXICAM 15 MG PO TABS: 15.0000 mg | ORAL_TABLET | Freq: Every day | ORAL | 0 refills | Status: DC

## 2016-05-19 NOTE — ED Triage Notes (Signed)
Pt states she has a pain that is on her right side. It goes from the neck down her shoulder and arm

## 2016-05-19 NOTE — Discharge Instructions (Signed)
Contact a health care provider if: °Your condition does not improve with treatment. °Get help right away if: °Your pain gets much worse and cannot be controlled with medicines. °You have weakness or numbness in your hand, arm, face, or leg. °You have a high fever. °You have a stiff, rigid neck. °You lose control of your bowels or your bladder (have incontinence). °You have trouble with walking, balance, or speaking. °

## 2016-05-19 NOTE — ED Provider Notes (Signed)
[facility] DEPT Provider Note   CSN: [id number] Arrival date & time: 05/19/16  5732     History   Chief Complaint Chief Complaint  Patient presents with  . Neck Injury    HPI Sandra Stafford is a 56 y.o. female past medical history of right arm pain. The patient has no previous history of neck or shoulder injuries. She does work on an Designer, television/film set at [employer]. She is right-hand dominant. She has noticed right neck and shoulder blade pain for the past month that has been progressively worsening. However, the past week has been extremely painful with pain down the right arm. She denies weakness. Her she's paresthesias in the fingers that is intermittent. She does not notice it is any worse with particular movement. She states she has been taking a lot of ibuprofen and is having some reflux symptoms. She has been trying to use ranitidine with it, however, does not seem to be helpful.  HPI  Past Medical History:  Diagnosis Date  . Allergy   . Asthma   . GERD (gastroesophageal reflux disease)     Patient Active Problem List   Diagnosis Date Noted  . VENEREAL WART 11/03/2006  . 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 OBESITY NOS 11/03/2006  . TOBACCO USER 11/03/2006  . ALLERGIC RHINITIS 11/03/2006  . PUD 11/03/2006  . DYSPEPSIA 11/03/2006    Past Surgical History:  Procedure Laterality Date  . ABDOMINAL HYSTERECTOMY      OB History    No data available       Home Medications    Prior to Admission medications   Medication Sig Start Date End Date Taking? Authorizing Provider  naproxen sodium (ANAPROX) 220 MG tablet Take 220 mg by mouth 2 (two) times daily as needed (for pain).   Yes Historical Provider, MD  albuterol (PROVENTIL HFA;VENTOLIN HFA) 108 (90 BASE) MCG/ACT inhaler Inhale 2 puffs into the lungs every 6 (six) hours as needed for wheezing or shortness of breath. Patient not taking: Reported on 05/19/2016 11/11/14   Mancel Bale, PA-C  amoxicillin (AMOXIL)  875 MG tablet Take 1 tablet (875 mg total) by mouth 2 (two) times daily. Patient not taking: Reported on 05/19/2016 11/11/14   Mancel Bale, PA-C  fluticasone [facility]) 50 MCG/ACT nasal spray Place 2 sprays into both nostrils daily. Patient not taking: Reported on 05/19/2016 11/11/14   Mancel Bale, PA-C    Family History Family History  Problem Relation Age of Onset  . Diabetes Mother   . Hypertension Mother     Social History Social History  Substance Use Topics  . Smoking status: Current Every Day Smoker    Packs/day: 0.50    Years: 42.00    Types: Cigarettes  . Smokeless tobacco: Not on file  . Alcohol use 1.2 - 1.8 oz/week    2 - 3 Standard drinks or equivalent per week     Allergies   Dust mite mixed allergen ext [mite (d. farinae)] and Pollen extract   Review of Systems Review of Systems  Constitutional: Negative for fever.  Musculoskeletal: Positive for myalgias. Negative for joint swelling.  Skin: Negative for color change.  Neurological: Negative for weakness.  All other systems reviewed and are negative.     Physical Exam Updated Vital Signs BP (!) 155/95   Pulse 66   Ht 5\' 5"  (1.651 m)   Wt 104.3 kg   SpO2 100%   BMI 38.27 kg/m   Physical Exam  Constitutional:  She is oriented to person, place, and time. She appears well-developed and well-nourished. No distress.  HENT:  Head: Normocephalic and atraumatic.  Eyes: Conjunctivae are normal. No scleral icterus.  Neck: Normal range of motion.  Cardiovascular: Normal rate, regular rhythm and normal heart sounds.  Exam reveals no gallop and no friction rub.   No murmur heard. Pulmonary/Chest: Effort normal and breath sounds normal. No respiratory distress.  Abdominal: Soft. Bowel sounds are normal. She exhibits no distension and no mass. There is no tenderness. There is no guarding.  Musculoskeletal:  Full range of motion and strength in the neck and right shoulder. Negative Tinel and Phalen sign.  Strong and equal grip strengths bilaterally. Normal radial pulses, sensation normal to light touch. Patient is guarding the neck and shoulder with contracted trapezius muscle. She has multiple active trigger points in the trapezius and neck muscles.  Neurological: She is alert and oriented to person, place, and time.  Skin: Skin is warm and dry. She is not diaphoretic.  Psychiatric: Her behavior is normal.  Nursing note and vitals reviewed.    ED Treatments / Results  Labs (all labs ordered are listed, but only abnormal results are displayed) Labs Reviewed - No data to display  EKG  EKG Interpretation None       Radiology No results found.  Procedures Procedures (including critical care time)  Medications Ordered in ED Medications - No data to display   Initial Impression / Assessment and Plan / ED Course  I have reviewed the triage vital signs and the nursing notes.  Pertinent labs & imaging results that were available during my care of the patient were reviewed by me and considered in my medical decision making (see chart for details).     patient with pain in my right arm. This may be muscular. However, I cannot rule out radiculopathy. At this point. Will start patient on a taper of prednisone. Recommend , alternating between sheet and ice. Patient is advised to rest and follow up with orthopedics. She  Does not have any weakness in the extremity. Normoactive from [place]. No signs of blood clot and arm. Patient is safe for discharge with outpatient follow-up.  Final Clinical Impressions(s) / ED Diagnoses   Final diagnoses:  Arm pain, right    New Prescriptions New Prescriptions   No medications on file     Margarita Mail, PA-C 05/19/16 [place], MD 05/19/16 0800

## 2017-02-22 DIAGNOSIS — Z79899 Other long term (current) drug therapy: Secondary | ICD-10-CM | POA: Diagnosis not present

## 2017-02-22 DIAGNOSIS — G8929 Other chronic pain: Secondary | ICD-10-CM | POA: Insufficient documentation

## 2017-02-22 DIAGNOSIS — F17210 Nicotine dependence, cigarettes, uncomplicated: Secondary | ICD-10-CM | POA: Insufficient documentation

## 2017-02-22 DIAGNOSIS — M25511 Pain in right shoulder: Secondary | ICD-10-CM | POA: Diagnosis present

## 2017-02-22 DIAGNOSIS — J45909 Unspecified asthma, uncomplicated: Secondary | ICD-10-CM | POA: Insufficient documentation

## 2017-02-23 ENCOUNTER — Emergency Department (HOSPITAL_COMMUNITY)
Admission: EM | Admit: 2017-02-23 | Discharge: 2017-02-23 | Disposition: A | Discharge status: Home / Self Care | Payer: No Typology Code available for payment source | Attending: Emergency Medicine | Admitting: Emergency Medicine

## 2017-02-23 ENCOUNTER — Other Ambulatory Visit: Payer: Self-pay

## 2017-02-23 ENCOUNTER — Encounter (HOSPITAL_COMMUNITY): Payer: Self-pay

## 2017-02-23 DIAGNOSIS — M25511 Pain in right shoulder: Secondary | ICD-10-CM

## 2017-02-23 DIAGNOSIS — G8929 Other chronic pain: Secondary | ICD-10-CM

## 2017-02-23 MED ORDER — HYDROCODONE-ACETAMINOPHEN 5-325 MG PO TABS
1.0000 | ORAL_TABLET | Freq: Once | ORAL | Status: AC
  Administered 2017-02-23: 1 via ORAL

## 2017-02-23 MED ORDER — CYCLOBENZAPRINE HCL 10 MG PO TABS: 10.0000 mg | ORAL_TABLET | Freq: Two times a day (BID) | ORAL | 0 refills | Status: DC | PRN

## 2017-02-23 MED ORDER — HYDROCODONE-ACETAMINOPHEN 5-325 MG PO TABS
2.0000 | ORAL_TABLET | Freq: Once | ORAL | Status: DC
  Filled 2017-02-23: qty 2

## 2017-02-23 NOTE — ED Provider Notes (Signed)
[facility] DEPT Provider Note   CSN: [id number] Arrival date & time: 02/22/17  2354     History   Chief Complaint Chief Complaint  Patient presents with  . Shoulder Pain    HPI Sandra Stafford is a 57 y.o. female.  Patient presents to the emergency department with a chief complaint of right shoulder pain.  States that it has been gradually worsening for months.  She has been seen in the past and was told that it was due to repetitive use.  She complains of pain with movement and overhead lifting.  She has taken many OTC medications, but does report that she has had relief with Flexeril.  She denies any new traumatic injuries.  She states the pain radiates down her arm.   The history is provided by the patient. No language interpreter was used.    Past Medical History:  Diagnosis Date  . Allergy   . Asthma   . GERD (gastroesophageal reflux disease)     Patient Active Problem List   Diagnosis Date Noted  . VENEREAL WART 11/03/2006  . 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 OBESITY NOS 11/03/2006  . TOBACCO USER 11/03/2006  . ALLERGIC RHINITIS 11/03/2006  . PUD 11/03/2006  . DYSPEPSIA 11/03/2006    Past Surgical History:  Procedure Laterality Date  . ABDOMINAL HYSTERECTOMY      OB History    No data available       Home Medications    Prior to Admission medications   Medication Sig Start Date End Date Taking? Authorizing Provider  albuterol (PROVENTIL HFA;VENTOLIN HFA) 108 (90 BASE) MCG/ACT inhaler Inhale 2 puffs into the lungs every 6 (six) hours as needed for wheezing or shortness of breath. Patient not taking: Reported on 05/19/2016 11/11/14   Mancel Bale, PA-C  amoxicillin (AMOXIL) 875 MG tablet Take 1 tablet (875 mg total) by mouth 2 (two) times daily. Patient not taking: Reported on 05/19/2016 11/11/14   Mancel Bale, PA-C  cyclobenzaprine (FLEXERIL) 10 MG tablet Take 1 tablet (10 mg total) by mouth 2 (two) times daily as needed  for muscle spasms. 02/23/17   Montine Circle, PA-C  fluticasone (FLONASE) 50 MCG/ACT nasal spray Place 2 sprays into both nostrils daily. Patient not taking: Reported on 05/19/2016 11/11/14   Mancel Bale, PA-C  HYDROcodone-acetaminophen (NORCO) 5-325 MG tablet Take 1 tablet by mouth every 6 (six) hours as needed for severe pain. 05/19/16   Margarita Mail, PA-C  meloxicam (MOBIC) 15 MG tablet Take 1 tablet (15 mg total) by mouth daily. 05/19/16   Margarita Mail, PA-C  naproxen sodium (ANAPROX) 220 MG tablet Take 220 mg by mouth 2 (two) times daily as needed (for pain).    [provider]  pantoprazole (PROTONIX) 20 MG tablet Take 1 tablet (20 mg total) by mouth daily. 05/19/16   Margarita Mail, PA-C  predniSONE (DELTASONE) 20 MG tablet 3 tabs po daily x 3 days, then 2 tabs x 3 days, then 1.5 tabs x 3 days, then 1 tab x 3 days, then 0.5 tabs x 3 days 05/19/16   Margarita Mail, PA-C    Family History Family History  Problem Relation Age of Onset  . Diabetes Mother   . Hypertension Mother     Social History Social History   Tobacco Use  . Smoking status: Current Every Day Smoker    Packs/day: 0.50    Years: 42.00    Pack years: 21.00    Types: Cigarettes  .  Smokeless tobacco: Never Used  Substance Use Topics  . Alcohol use: Yes    Alcohol/week: 1.2 - 1.8 oz    Types: 2 - 3 Standard drinks or equivalent per week  . Drug use: No     Allergies   Dust mite mixed allergen ext [mite (d. farinae)] and Pollen extract   Review of Systems Review of Systems  All other systems reviewed and are negative.    Physical Exam Updated Vital Signs BP (!) 135/93 (BP Location: Left Arm)   Pulse 91   Temp 98.7 F (37.1 C) (Oral)   Resp 18   Ht 5\' 7"  (1.702 m)   Wt 104.3 kg (230 lb)   SpO2 96%   BMI 36.02 kg/m   Physical Exam Nursing note and vitals reviewed.  Constitutional: Pt appears well-developed and well-nourished. No distress.  HENT:  Head: Normocephalic and  atraumatic.  Eyes: Conjunctivae are normal.  Neck: Normal range of motion.  Cardiovascular: Normal rate, regular rhythm. Intact distal pulses.   Capillary refill < 3 sec.  Pulmonary/Chest: Effort normal and breath sounds normal.  Musculoskeletal:  Right shoulder Pt exhibits no focal tenderness to palpation, no bony abnormality or deformity.   ROM: Limited secondary to pain  Strength: Limited secondary to pain Neurological: Pt  is alert. Coordination normal.  Sensation: 5/5 Skin: Skin is warm and dry. Pt is not diaphoretic.  No evidence of open wound or skin tenting Psychiatric: Pt has a normal mood and affect.     ED Treatments / Results  Labs (all labs ordered are listed, but only abnormal results are displayed) Labs Reviewed - No data to display  EKG  EKG Interpretation None       Radiology No results found.  Procedures Procedures (including critical care time)  Medications Ordered in ED Medications  HYDROcodone-acetaminophen (NORCO/VICODIN) 5-325 MG per tablet 2 tablet (not administered)     Initial Impression / Assessment and Plan / ED Course  I have reviewed the triage vital signs and the nursing notes.  Pertinent labs & imaging results that were available during my care of the patient were reviewed by me and considered in my medical decision making (see chart for details).    Patient with acute on chronic right shoulder pain.  No bony abnormality or deformity.  No traumatic injuries.  Symptoms seem to be repetitive use injury from overhead lifting hand other work activities.  I discussed that I cannot of strong pain medicine, but that I am happy to refill her Flexeril.  I have strongly encouraged orthopedic follow-up.  Patient understands and agrees with the plan.  I will give her a dose of pain medicine in the ED.  Final Clinical Impressions(s) / ED Diagnoses   Final diagnoses:  Chronic right shoulder pain    ED Discharge Orders        Ordered     cyclobenzaprine (FLEXERIL) 10 MG tablet  2 times daily PRN     02/23/17 0349       Montine Circle, PA-C 02/23/17 0352    Orpah Greek, MD 02/23/17 [phone]

## 2017-02-23 NOTE — ED Triage Notes (Signed)
States for months right shoulder and arm pain was seen by doctor given medications but now worse good right radial pulse noted.

## 2017-02-23 NOTE — ED Notes (Signed)
AVS explained in detail. Knows to follow up with orthopedic surgery Monday. Given work note. Ambulatory to car. No other c/c. A&Ox4.

## 2017-07-05 ENCOUNTER — Emergency Department (HOSPITAL_COMMUNITY)
Admission: EM | Admit: 2017-07-05 | Discharge: 2017-07-05 | Disposition: A | Discharge status: Home / Self Care | Payer: No Typology Code available for payment source | Attending: Emergency Medicine | Admitting: Emergency Medicine

## 2017-07-05 ENCOUNTER — Encounter (HOSPITAL_COMMUNITY): Payer: Self-pay | Admitting: Emergency Medicine

## 2017-07-05 DIAGNOSIS — J45909 Unspecified asthma, uncomplicated: Secondary | ICD-10-CM | POA: Insufficient documentation

## 2017-07-05 DIAGNOSIS — F17210 Nicotine dependence, cigarettes, uncomplicated: Secondary | ICD-10-CM | POA: Insufficient documentation

## 2017-07-05 DIAGNOSIS — M5413 Radiculopathy, cervicothoracic region: Secondary | ICD-10-CM

## 2017-07-05 DIAGNOSIS — G8929 Other chronic pain: Secondary | ICD-10-CM | POA: Insufficient documentation

## 2017-07-05 MED ORDER — CYCLOBENZAPRINE HCL 10 MG PO TABS: 10.0000 mg | ORAL_TABLET | Freq: Two times a day (BID) | ORAL | 0 refills | Status: DC | PRN

## 2017-07-05 MED ORDER — PREDNISONE 10 MG PO TABS: ORAL_TABLET | ORAL | 0 refills | Status: DC

## 2017-07-05 NOTE — ED Provider Notes (Signed)
[facility] EMERGENCY DEPARTMENT Provider Note   CSN: [id number] Arrival date & time: 07/05/17  1558     History   Chief Complaint Chief Complaint  Patient presents with  . Shoulder Pain    HPI Sandra Stafford is a 57 y.o. female.  Pt comes in with c/o right chronic shoulder pain and neck pain that has flared up from being really active. No new injury. No deficit. States had injection previously that has helped. Has had imaging in the last couple of months. She has taken all different kinds of medication and she is not getting any relief     Past Medical History:  Diagnosis Date  . Allergy   . Asthma   . GERD (gastroesophageal reflux disease)     Patient Active Problem List   Diagnosis Date Noted  . VENEREAL WART 11/03/2006  . 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 OBESITY NOS 11/03/2006  . TOBACCO USER 11/03/2006  . ALLERGIC RHINITIS 11/03/2006  . PUD 11/03/2006  . DYSPEPSIA 11/03/2006    Past Surgical History:  Procedure Laterality Date  . ABDOMINAL HYSTERECTOMY       OB History   None      Home Medications    Prior to Admission medications   Medication Sig Start Date End Date Taking? Authorizing Provider  albuterol (PROVENTIL HFA;VENTOLIN HFA) 108 (90 BASE) MCG/ACT inhaler Inhale 2 puffs into the lungs every 6 (six) hours as needed for wheezing or shortness of breath. Patient not taking: Reported on 05/19/2016 11/11/14   Mancel Bale, PA-C  amoxicillin (AMOXIL) 875 MG tablet Take 1 tablet (875 mg total) by mouth 2 (two) times daily. Patient not taking: Reported on 05/19/2016 11/11/14   Mancel Bale, PA-C  cyclobenzaprine (FLEXERIL) 10 MG tablet Take 1 tablet (10 mg total) by mouth 2 (two) times daily as needed for muscle spasms. 07/05/17   Glendell Docker, NP  fluticasone (FLONASE) 50 MCG/ACT nasal spray Place 2 sprays into both nostrils daily. Patient not taking: Reported on 05/19/2016 11/11/14   Mancel Bale, PA-C    HYDROcodone-acetaminophen (NORCO) 5-325 MG tablet Take 1 tablet by mouth every 6 (six) hours as needed for severe pain. 05/19/16   Margarita Mail, PA-C  meloxicam (MOBIC) 15 MG tablet Take 1 tablet (15 mg total) by mouth daily. 05/19/16   Margarita Mail, PA-C  naproxen sodium (ANAPROX) 220 MG tablet Take 220 mg by mouth 2 (two) times daily as needed (for pain).    [provider]  pantoprazole (PROTONIX) 20 MG tablet Take 1 tablet (20 mg total) by mouth daily. 05/19/16   Margarita Mail, PA-C  predniSONE (DELTASONE) 10 MG tablet 6 day stepdown dose 07/05/17   Glendell Docker, NP    Family History Family History  Problem Relation Age of Onset  . Diabetes Mother   . Hypertension Mother     Social History Social History   Tobacco Use  . Smoking status: Current Every Day Smoker    Packs/day: 0.50    Years: 42.00    Pack years: 21.00    Types: Cigarettes  . Smokeless tobacco: Never Used  Substance Use Topics  . Alcohol use: Yes    Alcohol/week: 1.2 - 1.8 oz    Types: 2 - 3 Standard drinks or equivalent per week  . Drug use: No     Allergies   Dust mite mixed allergen ext [mite (d. farinae)] and Pollen extract   Review of Systems Review of Systems  All other  systems reviewed and are negative.    Physical Exam Updated Vital Signs BP (!) 153/88 (BP Location: Left Arm)   Pulse 89   Temp 99 F (37.2 C)   Resp 18   SpO2 99%   Physical Exam  Constitutional: She is oriented to person, place, and time. She appears well-developed and well-nourished.  Cardiovascular: Normal rate.  Pulmonary/Chest: Effort normal.  Musculoskeletal:  Right sided cervical paraspinal tenderness. Good rom and strength of the right shoulder. Generalized tenderness  Neurological: She is alert and oriented to person, place, and time.  Skin: Skin is warm and dry.  Psychiatric: She has a normal mood and affect.  Nursing note and vitals reviewed.    ED Treatments / Results  Labs (all  labs ordered are listed, but only abnormal results are displayed) Labs Reviewed - No data to display  EKG None  Radiology No results found.  Procedures Procedures (including critical care time)  Medications Ordered in ED Medications - No data to display   Initial Impression / Assessment and Plan / ED Course  I have reviewed the triage vital signs and the nursing notes.  Pertinent labs & imaging results that were available during my care of the patient were reviewed by me and considered in my medical decision making (see chart for details).    No acute injury. Will treat symptomatically with prednisone and flexeril  Final Clinical Impressions(s) / ED Diagnoses   Final diagnoses:  Radiculopathy of cervicothoracic region    ED Discharge Orders        Ordered    cyclobenzaprine (FLEXERIL) 10 MG tablet  2 times daily PRN     07/05/17 1631    predniSONE (DELTASONE) 10 MG tablet     07/05/17 1631       Glendell Docker, NP 07/05/17 1635    Carmin Muskrat, MD 07/06/17 [phone]

## 2017-07-05 NOTE — ED Notes (Signed)
ED Provider at bedside. 

## 2017-07-05 NOTE — ED Triage Notes (Signed)
Pt presents to ED for assessment of right shoulder pain with a chronic history, diagnosed with arthritis, carpal tunnel and DDD of neck.  Pt states pain worsened after having to do an active job at work on Monday.

## 2019-05-17 ENCOUNTER — Ambulatory Visit (INDEPENDENT_AMBULATORY_CARE_PROVIDER_SITE_OTHER): Payer: 59 | Admitting: Family Medicine

## 2019-05-17 ENCOUNTER — Encounter: Payer: Self-pay | Admitting: Family Medicine

## 2019-05-17 ENCOUNTER — Other Ambulatory Visit: Payer: Self-pay

## 2019-05-17 VITALS — BP 120/78 | HR 87 | Temp 98.1°F | Ht 64.0 in | Wt 248.0 lb

## 2019-05-17 DIAGNOSIS — M2550 Pain in unspecified joint: Secondary | ICD-10-CM

## 2019-05-17 DIAGNOSIS — M353 Polymyalgia rheumatica: Secondary | ICD-10-CM | POA: Insufficient documentation

## 2019-05-17 DIAGNOSIS — Z6841 Body Mass Index (BMI) 40.0 and over, adult: Secondary | ICD-10-CM | POA: Diagnosis not present

## 2019-05-17 DIAGNOSIS — E559 Vitamin D deficiency, unspecified: Secondary | ICD-10-CM | POA: Diagnosis not present

## 2019-05-17 DIAGNOSIS — Z0000 Encounter for general adult medical examination without abnormal findings: Secondary | ICD-10-CM | POA: Insufficient documentation

## 2019-05-17 DIAGNOSIS — Z720 Tobacco use: Secondary | ICD-10-CM | POA: Diagnosis not present

## 2019-05-17 HISTORY — DX: Encounter for general adult medical examination without abnormal findings: Z00.00

## 2019-05-17 HISTORY — DX: Pain in unspecified joint: M25.50

## 2019-05-17 HISTORY — DX: Polymyalgia rheumatica: M35.3

## 2019-05-17 MED ORDER — AMITRIPTYLINE HCL 25 MG PO TABS: 25.0000 mg | ORAL_TABLET | Freq: Every evening | ORAL | 0 refills | Status: DC | PRN

## 2019-05-17 NOTE — Progress Notes (Addendum)
New Patient Office Visit  Subjective:  Patient ID: Sandra Stafford, female    DOB: 06-20-1960  Age: 59 y.o. MRN: [id number]  CC:  Chief Complaint  Patient presents with  . Establish Care    Pt c/o of arthritis pain in arms, hands shoulders and neck.  Pt has been seen recently at Urgent Care due to these issues.    HPI Sandra Stafford presents for evaluation of "arthritis pain" all over my body for the last few years.  Patient works third shift in a Psychologist, educational job and she thinks that it could be job related.  She mostly stands in a fixed place for her shift.  She does have mats on the floor.  She thinks that it may have started with carpal tunnel syndrome in her wrist that she is treated somewhat successfully with nighttime wrist splinting.  She has lost her splints and has not been able to find them.  She complains of pains in her arms shoulders neck lower back hips and legs.  She feels stiff throughout her body after she has been sitting or resting for a while.  It was difficult for her to describe specific joint pains or stiffness other than in her lateral hip area.  She definitely has pains in her shoulders and hip area.  Denies weakness or tingling.  She lives with her invalid mother and helps to take care of her.  She complains of sadness associated with these problems for the last few months.  She has been largely lost to follow-up for ongoing health care.  She does smoke about a half a pack a day but rarely drinks alcohol.  Past Medical History:  Diagnosis Date  . Allergy   . Asthma   . GERD (gastroesophageal reflux disease)     Past Surgical History:  Procedure Laterality Date  . ABDOMINAL HYSTERECTOMY      Family History  Problem Relation Age of Onset  . Diabetes Mother   . Hypertension Mother     Social History   Socioeconomic History  . Marital status: Single    Spouse name: Not on file  . Number of children: Not on file  . Years of education: Not on file  . Highest  education level: Not on file  Occupational History  . Not on file  Tobacco Use  . Smoking status: Current Every Day Smoker    Packs/day: 0.50    Years: 42.00    Pack years: 21.00    Types: Cigarettes  . Smokeless tobacco: Never Used  Substance and Sexual Activity  . Alcohol use: Yes    Alcohol/week: 2.0 - 3.0 standard drinks    Types: 2 - 3 Standard drinks or equivalent per week    Comment: rarely  . Drug use: No  . Sexual activity: Not on file  Other Topics Concern  . Not on file  Social History Narrative  . Not on file   Social Determinants of Health   Financial Resource Strain:   . Difficulty of Paying Living Expenses:   Food Insecurity:   . Worried About Charity fundraiser in the Last Year:   . Arboriculturist in the Last Year:   Transportation Needs:   . Film/video editor (Medical):   Marland Kitchen Lack of Transportation (Non-Medical):   Physical Activity:   . Days of Exercise per Week:   . Minutes of Exercise per Session:   Stress:   . Feeling of Stress :  Social Connections:   . Frequency of Communication with Friends and Family:   . Frequency of Social Gatherings with Friends and Family:   . Attends Religious Services:   . Active Member of Clubs or Organizations:   . Attends Archivist Meetings:   Marland Kitchen Marital Status:   Intimate Partner Violence:   . Fear of Current or Ex-Partner:   . Emotionally Abused:   Marland Kitchen Physically Abused:   . Sexually Abused:     ROS Review of Systems  Constitutional: Negative.   HENT: Negative.   Eyes: Negative for photophobia and visual disturbance.  Respiratory: Negative.   Cardiovascular: Negative.   Gastrointestinal: Negative.   Endocrine: Negative for polyphagia and polyuria.  Genitourinary: Negative.   Musculoskeletal: Positive for back pain, myalgias, neck pain and neck stiffness.  Skin: Negative for pallor and rash.  Allergic/Immunologic: Negative for immunocompromised state.  Neurological: Negative for weakness  and numbness.  Hematological: Does not bruise/bleed easily.  Psychiatric/Behavioral: Positive for dysphoric mood.   Depression screen [facility] 2/9 05/17/2019 11/11/2014  Decreased Interest 2 0  Down, Depressed, Hopeless 1 0  PHQ - 2 Score 3 0  Altered sleeping 2 -  Tired, decreased energy 2 -  Change in appetite 2 -  Feeling bad or failure about yourself  1 -  Trouble concentrating 1 -  Moving slowly or fidgety/restless 2 -  Suicidal thoughts 0 -  PHQ-9 Score 13 -  Difficult doing work/chores Somewhat difficult -    Objective:   Today's Vitals: BP 120/78 (BP Location: Left Arm, Patient Position: Sitting, Cuff Size: Large)   Pulse 87   Temp 98.1 F (36.7 C) (Temporal)   Ht 5\' 4"  (1.626 m)   Wt 248 lb (112.5 kg)   SpO2 95%   BMI 42.57 kg/m   Physical Exam Vitals and nursing note reviewed.  Constitutional:      General: She is not in acute distress.    Appearance: She is obese. She is not ill-appearing, toxic-appearing or diaphoretic.  HENT:     Head: Normocephalic and atraumatic.     Right Ear: External ear normal.     Left Ear: External ear normal.  Eyes:     General: No scleral icterus.       Right eye: No discharge.        Left eye: No discharge.     Conjunctiva/sclera: Conjunctivae normal.     Pupils: Pupils are equal, round, and reactive to light.  Cardiovascular:     Rate and Rhythm: Normal rate and regular rhythm.  Pulmonary:     Effort: Pulmonary effort is normal.     Breath sounds: Normal breath sounds.  Musculoskeletal:     Right shoulder: No tenderness. Normal range of motion. Normal strength.     Left shoulder: No tenderness. Normal range of motion. Normal strength.     Right upper arm: No tenderness.     Left upper arm: No tenderness.     Right elbow: Normal range of motion. No tenderness.     Left elbow: Normal range of motion. No tenderness.     Right forearm: No tenderness.     Left forearm: No tenderness.     Right wrist: No tenderness. Normal  range of motion.     Left wrist: No tenderness. Normal range of motion.     Cervical back: No rigidity, tenderness or bony tenderness. Normal range of motion.     Thoracic back: Normal range of motion.  Lumbar back: Normal range of motion.     Right hip: Normal range of motion (mild decreased rom).     Left hip: Decreased range of motion (mild decreased rom).  Lymphadenopathy:     Cervical: No cervical adenopathy.  Skin:    General: Skin is warm and dry.  Neurological:     Mental Status: She is alert and oriented to person, place, and time.  Psychiatric:        Mood and Affect: Mood normal.        Behavior: Behavior normal.     Assessment & Plan:   Problem List Items Addressed This Visit      Musculoskeletal and Integument   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HCC)   Relevant Medications   amitriptyline (ELAVIL) 25 MG tablet   Other Relevant Orders   CBC (Completed)   Hepatitis C antibody (Completed)   TSH (Completed)   Sedimentation rate (Completed)   C-reactive protein (Completed)   Uric acid (Completed)     Other   Class 3 severe obesity due to excess calories with body mass index (BMI) of 40.0 to 44.9 in adult ([place])   Relevant Orders   TSH (Completed)   VITAMIN D 25 Hydroxy (Vit-D Deficiency, Fractures) (Completed)   Amb ref to Medical Nutrition Therapy-MNT   Tobacco use - Primary   Healthcare maintenance   Relevant Orders   CBC (Completed)   Comprehensive metabolic panel (Completed)   LDL cholesterol, direct (Completed)   Hepatitis C antibody (Completed)   Lipid panel (Completed)   HIV Antibody (routine testing w rflx) (Completed)   TSH (Completed)   Urinalysis, Routine w reflex microscopic (Completed)   Vitamin D deficiency   Relevant Medications   Vitamin D, Ergocalciferol, (DRISDOL) 1.25 MG (50000 UNIT) CAPS capsule      Outpatient Encounter Medications as of 05/17/2019  Medication Sig  . cyclobenzaprine (FLEXERIL) 10 MG tablet Take 1 tablet (10 mg total) by mouth 2  (two) times daily as needed for muscle spasms.  . pantoprazole (PROTONIX) 20 MG tablet Take 1 tablet (20 mg total) by mouth daily.  Marland Kitchen albuterol (PROVENTIL HFA;VENTOLIN HFA) 108 (90 BASE) MCG/ACT inhaler Inhale 2 puffs into the lungs every 6 (six) hours as needed for wheezing or shortness of breath. (Patient not taking: Reported on 05/19/2016)  . amitriptyline (ELAVIL) 25 MG tablet Take 1 tablet (25 mg total) by mouth at bedtime as needed for sleep.  . fluticasone (FLONASE) 50 MCG/ACT nasal spray Place 2 sprays into both nostrils daily. (Patient not taking: Reported on 05/19/2016)  . HYDROcodone-acetaminophen (NORCO) 5-325 MG tablet Take 1 tablet by mouth every 6 (six) hours as needed for severe pain. (Patient not taking: Reported on 05/17/2019)  . meloxicam (MOBIC) 15 MG tablet Take 1 tablet (15 mg total) by mouth daily. (Patient not taking: Reported on 05/17/2019)  . naproxen sodium (ANAPROX) 220 MG tablet Take 220 mg by mouth 2 (two) times daily as needed (for pain).  . Vitamin D, Ergocalciferol, (DRISDOL) 1.25 MG (50000 UNIT) CAPS capsule Take 1 capsule (50,000 Units total) by mouth every 7 (seven) days.  . [DISCONTINUED] amoxicillin (AMOXIL) 875 MG tablet Take 1 tablet (875 mg total) by mouth 2 (two) times daily. (Patient not taking: Reported on 05/19/2016)  . [DISCONTINUED] predniSONE (DELTASONE) 10 MG tablet 6 day stepdown dose (Patient not taking: Reported on 05/17/2019)   No facility-administered encounter medications on file as of 05/17/2019.    Follow-up: Return in about 2 weeks (around 05/31/2019).  PMR?  Patient given  information on health maintenance disease prevention obesity and steps to quit smoking.  Agrees to go for nutritional counseling.  Low-dose Elavil for sleep.  Follow-up in 2 weeks.   Libby Maw, MD

## 2019-05-17 NOTE — Patient Instructions (Addendum)
Health Maintenance, Female Adopting a healthy lifestyle and getting preventive care are important in promoting health and wellness. Ask your health care provider about:  The right schedule for you to have regular tests and exams.  Things you can do on your own to prevent diseases and keep yourself healthy. What should I know about diet, weight, and exercise? Eat a healthy diet   Eat a diet that includes plenty of vegetables, fruits, low-fat dairy products, and lean protein.  Do not eat a lot of foods that are high in solid fats, added sugars, or sodium. Maintain a healthy weight Body mass index (BMI) is used to identify weight problems. It estimates body fat based on height and weight. Your health care provider can help determine your BMI and help you achieve or maintain a healthy weight. Get regular exercise Get regular exercise. This is one of the most important things you can do for your health. Most adults should:  Exercise for at least 150 minutes each week. The exercise should increase your heart rate and make you sweat (moderate-intensity exercise).  Do strengthening exercises at least twice a week. This is in addition to the moderate-intensity exercise.  Spend less time sitting. Even light physical activity can be beneficial. Watch cholesterol and blood lipids Have your blood tested for lipids and cholesterol at 59 years of age, then have this test every 5 years. Have your cholesterol levels checked more often if:  Your lipid or cholesterol levels are high.  You are older than 59 years of age.  You are at high risk for heart disease. What should I know about cancer screening? Depending on your health history and family history, you may need to have cancer screening at various ages. This may include screening for:  Breast cancer.  Cervical cancer.  Colorectal cancer.  Skin cancer.  Lung cancer. What should I know about heart disease, diabetes, and high blood  pressure? Blood pressure and heart disease  High blood pressure causes heart disease and increases the risk of stroke. This is more likely to develop in people who have high blood pressure readings, are of African descent, or are overweight.  Have your blood pressure checked: ? Every 3-5 years if you are 54-62 years of age. ? Every year if you are 93 years old or older. Diabetes Have regular diabetes screenings. This checks your fasting blood sugar level. Have the screening done:  Once every three years after age 69 if you are at a normal weight and have a low risk for diabetes.  More often and at a younger age if you are overweight or have a high risk for diabetes. What should I know about preventing infection? Hepatitis B If you have a higher risk for hepatitis B, you should be screened for this virus. Talk with your health care provider to find out if you are at risk for hepatitis B infection. Hepatitis C Testing is recommended for:  Everyone born from [birth year] through 1965.  Anyone with known risk factors for hepatitis C. Sexually transmitted infections (STIs)  Get screened for STIs, including gonorrhea and chlamydia, if: ? You are sexually active and are younger than 59 years of age. ? You are older than 59 years of age and your health care provider tells you that you are at risk for this type of infection. ? Your sexual activity has changed since you were last screened, and you are at increased risk for chlamydia or gonorrhea. Ask your health care provider if  you are at risk. °· Ask your health care provider about whether you are at high risk for HIV. Your health care provider may recommend a prescription medicine to help prevent HIV infection. If you choose to take medicine to prevent HIV, you should first get tested for HIV. You should then be tested every 3 months for as long as you are taking the medicine. °Pregnancy °· If you are about to stop having your period (premenopausal) and  you may become pregnant, seek counseling before you get pregnant. °· Take 400 to 800 micrograms (mcg) of folic acid every day if you become pregnant. °· Ask for birth control (contraception) if you want to prevent pregnancy. °Osteoporosis and menopause °Osteoporosis is a disease in which the bones lose minerals and strength with aging. This can result in bone fractures. If you are 65 years old or older, or if you are at risk for osteoporosis and fractures, ask your health care provider if you should: °· Be screened for bone loss. °· Take a calcium or vitamin D supplement to lower your risk of fractures. °· Be given hormone replacement therapy (HRT) to treat symptoms of menopause. °Follow these instructions at home: °Lifestyle °· Do not use any products that contain nicotine or tobacco, such as cigarettes, e-cigarettes, and chewing tobacco. If you need help quitting, ask your health care provider. °· Do not use street drugs. °· Do not share needles. °· Ask your health care provider for help if you need support or information about quitting drugs. °Alcohol use °· Do not drink alcohol if: °? Your health care provider tells you not to drink. °? You are pregnant, may be pregnant, or are planning to become pregnant. °· If you drink alcohol: °? Limit how much you use to 0-1 drink a day. °? Limit intake if you are breastfeeding. °· Be aware of how much alcohol is in your drink. In the [place] one drink equals one 12 oz bottle of beer (355 mL), one 5 oz glass of wine (148 mL), or one 1½ oz glass of hard liquor (44 mL). °General instructions °· Schedule regular health, dental, and eye exams. °· Stay current with your vaccines. °· Tell your health care provider if: °? You often feel depressed. °? You have ever been abused or do not feel safe at home. °Summary °· Adopting a healthy lifestyle and getting preventive care are important in promoting health and wellness. °· Follow your health care provider's instructions about healthy  diet, exercising, and getting tested or screened for diseases. °· Follow your health care provider's instructions on monitoring your cholesterol and blood pressure. °This information is not intended to replace advice given to you by your health care provider. Make sure you discuss any questions you have with your health care provider. °Document Revised: 01/07/2018 Document Reviewed: 01/07/2018 °Elsevier Patient Education © 2020 Else[employer] Inc. ° °Preventive Care 40-64 Years Old, Female °Preventive care refers to visits with your health care provider and lifestyle choices that can promote health and wellness. This includes: °· A yearly physical exam. This may also be called an annual well check. °· Regular dental visits and eye exams. °· Immunizations. °· Screening for certain conditions. °· Healthy lifestyle choices, such as eating a healthy diet, getting regular exercise, not using drugs or products that contain nicotine and tobacco, and limiting alcohol use. °What can I expect for my preventive care visit? °Physical exam °Your health care provider will check your: °· Height and weight. This may be used   to calculate body mass index (BMI), which tells if you are at a healthy weight. °· Heart rate and blood pressure. °· Skin for abnormal spots. °Counseling °Your health care provider may ask you questions about your: °· Alcohol, tobacco, and drug use. °· Emotional well-being. °· Home and relationship well-being. °· Sexual activity. °· Eating habits. °· Work and work environment. °· Method of birth control. °· Menstrual cycle. °· Pregnancy history. °What immunizations do I need? ° °Influenza (flu) vaccine °· This is recommended every year. °Tetanus, diphtheria, and pertussis (Tdap) vaccine °· You may need a Td booster every 10 years. °Varicella (chickenpox) vaccine °· You may need this if you have not been vaccinated. °Zoster (shingles) vaccine °· You may need this after age 60. °Measles, mumps, and rubella (MMR)  vaccine °· You may need at least one dose of MMR if you were born in 1957 or later. You may also need a second dose. °Pneumococcal conjugate (PCV13) vaccine °· You may need this if you have certain conditions and were not previously vaccinated. °Pneumococcal polysaccharide (PPSV23) vaccine °· You may need one or two doses if you smoke cigarettes or if you have certain conditions. °Meningococcal conjugate (MenACWY) vaccine °· You may need this if you have certain conditions. °Hepatitis A vaccine °· You may need this if you have certain conditions or if you travel or work in places where you may be exposed to hepatitis A. °Hepatitis B vaccine °· You may need this if you have certain conditions or if you travel or work in places where you may be exposed to hepatitis B. °Haemophilus influenzae type b (Hib) vaccine °· You may need this if you have certain conditions. °Human papillomavirus (HPV) vaccine °· If recommended by your health care provider, you may need three doses over 6 months. °You may receive vaccines as individual doses or as more than one vaccine together in one shot (combination vaccines). Talk with your health care provider about the risks and benefits of combination vaccines. °What tests do I need? °Blood tests °· Lipid and cholesterol levels. These may be checked every 5 years, or more frequently if you are over 50 years old. °· Hepatitis C test. °· Hepatitis B test. °Screening °· Lung cancer screening. You may have this screening every year starting at age 55 if you have a 30-pack-year history of smoking and currently smoke or have quit within the past 15 years. °· Colorectal cancer screening. All adults should have this screening starting at age 50 and continuing until age 75. Your health care provider may recommend screening at age 45 if you are at increased risk. You will have tests every 1-10 years, depending on your results and the type of screening test. °· Diabetes screening. This is done by  checking your blood sugar (glucose) after you have not eaten for a while (fasting). You may have this done every 1-3 years. °· Mammogram. This may be done every 1-2 years. Talk with your health care provider about when you should start having regular mammograms. This may depend on whether you have a family history of breast cancer. °· BRCA-related cancer screening. This may be done if you have a family history of breast, ovarian, tubal, or peritoneal cancers. °· Pelvic exam and Pap test. This may be done every 3 years starting at age 21. Starting at age 30, this may be done every 5 years if you have a Pap test in combination with an HPV test. °Other tests °· Sexually transmitted disease (  STD) testing.  Bone density scan. This is done to screen for osteoporosis. You may have this scan if you are at high risk for osteoporosis. Follow these instructions at home: Eating and drinking  Eat a diet that includes fresh fruits and vegetables, whole grains, lean protein, and low-fat dairy.  Take vitamin and mineral supplements as recommended by your health care provider.  Do not drink alcohol if: ? Your health care provider tells you not to drink. ? You are pregnant, may be pregnant, or are planning to become pregnant.  If you drink alcohol: ? Limit how much you have to 0-1 drink a day. ? Be aware of how much alcohol is in your drink. In the [place] one drink equals one 12 oz bottle of beer (355 mL), one 5 oz glass of wine (148 mL), or one 1 oz glass of hard liquor (44 mL). Lifestyle  Take daily care of your teeth and gums.  Stay active. Exercise for at least 30 minutes on 5 or more days each week.  Do not use any products that contain nicotine or tobacco, such as cigarettes, e-cigarettes, and chewing tobacco. If you need help quitting, ask your health care provider.  If you are sexually active, practice safe sex. Use a condom or other form of birth control (contraception) in order to prevent pregnancy  and STIs (sexually transmitted infections).  If told by your health care provider, take low-dose aspirin daily starting at age 66. What's next?  Visit your health care provider once a year for a well check visit.  Ask your health care provider how often you should have your eyes and teeth checked.  Stay up to date on all vaccines. This information is not intended to replace advice given to you by your health care provider. Make sure you discuss any questions you have with your health care provider. Document Revised: 09/25/2017 Document Reviewed: 09/25/2017 Elsevier Patient Education  [place].  Obesity, Adult Obesity is having too much body fat. Being obese means that your weight is more than what is healthy for you. BMI is a number that explains how much body fat you have. If you have a BMI of 30 or more, you are obese. Obesity is often caused by eating or drinking more calories than your body uses. Changing your lifestyle can help you lose weight. Obesity can cause serious health problems, such as:  Stroke.  Coronary artery disease (CAD).  Type 2 diabetes.  Some types of cancer, including cancers of the colon, breast, uterus, and gallbladder.  Osteoarthritis.  High blood pressure (hypertension).  High cholesterol.  Sleep apnea.  Gallbladder stones.  Infertility problems. What are the causes?  Eating meals each day that are high in calories, sugar, and fat.  Being born with genes that may make you more likely to become obese.  Having a medical condition that causes obesity.  Taking certain medicines.  Sitting a lot (having a sedentary lifestyle).  Not getting enough sleep.  Drinking a lot of drinks that have sugar in them. What increases the risk?  Having a family history of obesity.  Being an [place] American woman.  Being a Hispanic man.  Living in an area with limited access to: ? Romilda Garret, recreation centers, or sidewalks. ? Healthy food  choices, such as grocery stores and farmers' markets. What are the signs or symptoms? The main sign is having too much body fat. How is this treated?  Treatment for this condition often includes changing your  lifestyle. Treatment may include: ? Changing your diet. This may include making a healthy meal plan. ? Exercise. This may include activity that causes your heart to beat faster (aerobic exercise) and strength training. Work with your doctor to design a program that works for you. ? Medicine to help you lose weight. This may be used if you are not able to lose 1 pound a week after 6 weeks of healthy eating and more exercise. ? Treating conditions that cause the obesity. ? Surgery. Options may include gastric banding and gastric bypass. This may be done if:  Other treatments have not helped to improve your condition.  You have a BMI of 40 or higher.  You have life-threatening health problems related to obesity. Follow these instructions at home: Eating and drinking   Follow advice from your doctor about what to eat and drink. Your doctor may tell you to: ? Limit fast food, sweets, and processed snack foods. ? Choose low-fat options. For example, choose low-fat milk instead of whole milk. ? Eat 5 or more servings of fruits or vegetables each day. ? Eat at home more often. This gives you more control over what you eat. ? Choose healthy foods when you eat out. ? Learn to read food labels. This will help you learn how much food is in 1 serving. ? Keep low-fat snacks available. ? Avoid drinks that have a lot of sugar in them. These include soda, fruit juice, iced tea with sugar, and flavored milk.  Drink enough water to keep your pee (urine) pale yellow.  Do not go on fad diets. Physical activity  Exercise often, as told by your doctor. Most adults should get up to 150 minutes of moderate-intensity exercise every week.Ask your doctor: ? What types of exercise are safe for  you. ? How often you should exercise.  Warm up and stretch before being active.  Do slow stretching after being active (cool down).  Rest between times of being active. Lifestyle  Work with your doctor and a food expert (dietitian) to set a weight-loss goal that is best for you.  Limit your screen time.  Find ways to reward yourself that do not involve food.  Do not drink alcohol if: ? Your doctor tells you not to drink. ? You are pregnant, may be pregnant, or are planning to become pregnant.  If you drink alcohol: ? Limit how much you use to:  0-1 drink a day for women.  0-2 drinks a day for men. ? Be aware of how much alcohol is in your drink. In the [place] one drink equals one 12 oz bottle of beer (355 mL), one 5 oz glass of wine (148 mL), or one 1 oz glass of hard liquor (44 mL). General instructions  Keep a weight-loss journal. This can help you keep track of: ? The food that you eat. ? How much exercise you get.  Take over-the-counter and prescription medicines only as told by your doctor.  Take vitamins and supplements only as told by your doctor.  Think about joining a support group.  Keep all follow-up visits as told by your doctor. This is important. Contact a doctor if:  You cannot meet your weight loss goal after you have changed your diet and lifestyle for 6 weeks. Get help right away if you:  Are having trouble breathing.  Are having thoughts of harming yourself. Summary  Obesity is having too much body fat.  Being obese means that your weight is  more than what is healthy for you.  Work with your doctor to set a weight-loss goal.  Get regular exercise as told by your doctor. This information is not intended to replace advice given to you by your health care provider. Make sure you discuss any questions you have with your health care provider. Document Revised: 09/18/2017 Document Reviewed: 09/18/2017 Elsevier Patient Education  2020 [employer].  Steps to Quit Smoking Smoking tobacco is the leading cause of preventable death. It can affect almost every organ in the body. Smoking puts you and those around you at risk for developing many serious chronic diseases. Quitting smoking can be difficult, but it is one of the best things that you can do for your health. It is never too late to quit. How do I get ready to quit? When you decide to quit smoking, create a plan to help you succeed. Before you quit:  Pick a date to quit. Set a date within the next 2 weeks to give you time to prepare.  Write down the reasons why you are quitting. Keep this list in places where you will see it often.  Tell your family, friends, and co-workers that you are quitting. Support from your loved ones can make quitting easier.  Talk with your health care provider about your options for quitting smoking.  Find out what treatment options are covered by your health insurance.  Identify people, places, things, and activities that make you want to smoke (triggers). Avoid them. What first steps can I take to quit smoking?  Throw away all cigarettes at home, at work, and in your car.  Throw away smoking accessories, such as Scientist, research (medical).  Clean your car. Make sure to empty the ashtray.  Clean your home, including curtains and carpets. What strategies can I use to quit smoking? Talk with your health care provider about combining strategies, such as taking medicines while you are also receiving in-person counseling. Using these two strategies together makes you more likely to succeed in quitting than if you used either strategy on its own.  If you are pregnant or breastfeeding, talk with your health care provider about finding counseling or other support strategies to quit smoking. Do not take medicine to help you quit smoking unless your health care provider tells you to do so. To quit smoking: Quit right away  Quit smoking completely, instead  of gradually reducing how much you smoke over a period of time. Research shows that stopping smoking right away is more successful than gradually quitting.  Attend in-person counseling to help you build problem-solving skills. You are more likely to succeed in quitting if you attend counseling sessions regularly. Even short sessions of 10 minutes can be effective. Take medicine You may take medicines to help you quit smoking. Some medicines require a prescription and some you can purchase over-the-counter. Medicines may have nicotine in them to replace the nicotine in cigarettes. Medicines may:  Help to stop cravings.  Help to relieve withdrawal symptoms. Your health care provider may recommend:  Nicotine patches, gum, or lozenges.  Nicotine inhalers or sprays.  Non-nicotine medicine that is taken by mouth. Find resources Find resources and support systems that can help you to quit smoking and remain smoke-free after you quit. These resources are most helpful when you use them often. They include:  Online chats with a Social worker.  Telephone quitlines.  Printed Furniture conservator/restorer.  Support groups or group counseling.  Text messaging programs.  Mobile phone  apps or applications. Use apps that can help you stick to your quit plan by providing reminders, tips, and encouragement. There are many free apps for mobile devices as well as websites. Examples include Quit Guide from the [employer] and smokefree.gov What things can I do to make it easier to quit?   Reach out to your family and friends for support and encouragement. Call telephone quitlines (1-800-QUIT-NOW), reach out to support groups, or work with a counselor for support.  Ask people who smoke to avoid smoking around you.  Avoid places that trigger you to smoke, such as bars, parties, or smoke-break areas at work.  Spend time with people who do not smoke.  Lessen the stress in your life. Stress can be a smoking trigger for some  people. To lessen stress, try: ? Exercising regularly. ? Doing deep-breathing exercises. ? Doing yoga. ? Meditating. ? Performing a body scan. This involves closing your eyes, scanning your body from head to toe, and noticing which parts of your body are particularly tense. Try to relax the muscles in those areas. How will I feel when I quit smoking? Day 1 to 3 weeks Within the first 24 hours of quitting smoking, you may start to feel withdrawal symptoms. These symptoms are usually most noticeable 2-3 days after quitting, but they usually do not last for more than 2-3 weeks. You may experience these symptoms:  Mood swings.  Restlessness, anxiety, or irritability.  Trouble concentrating.  Dizziness.  Strong cravings for sugary foods and nicotine.  Mild weight gain.  Constipation.  Nausea.  Coughing or a sore throat.  Changes in how the medicines that you take for unrelated issues work in your body.  Depression.  Trouble sleeping (insomnia). Week 3 and afterward After the first 2-3 weeks of quitting, you may start to notice more positive results, such as:  Improved sense of smell and taste.  Decreased coughing and sore throat.  Slower heart rate.  Lower blood pressure.  Clearer skin.  The ability to breathe more easily.  Fewer sick days. Quitting smoking can be very challenging. Do not get discouraged if you are not successful the first time. Some people need to make many attempts to quit before they achieve long-term success. Do your best to stick to your quit plan, and talk with your health care provider if you have any questions or concerns. Summary  Smoking tobacco is the leading cause of preventable death. Quitting smoking is one of the best things that you can do for your health.  When you decide to quit smoking, create a plan to help you succeed.  Quit smoking right away, not slowly over a period of time.  When you start quitting, seek help from your  health care provider, family, or friends. This information is not intended to replace advice given to you by your health care provider. Make sure you discuss any questions you have with your health care provider. Document Revised: 10/09/2018 Document Reviewed: 04/04/2018 Elsevier Patient Education  [place].

## 2019-05-18 LAB — LIPID PANEL
Cholesterol: 158 mg/dL (ref 0–200)
HDL: 60.5 mg/dL (ref >39.00)
LDL Cholesterol: 68 mg/dL (ref 0–99)
NonHDL: 97.73
Total CHOL/HDL Ratio: 3
Triglycerides: 151 mg/dL — ABNORMAL HIGH (ref 0.0–149.0)
VLDL: 30.2 mg/dL (ref 0.0–40.0)

## 2019-05-18 LAB — CBC
HCT: 42.1 % (ref 36.0–46.0)
Hemoglobin: 14.2 g/dL (ref 12.0–15.0)
MCHC: 33.7 g/dL (ref 30.0–36.0)
MCV: 89.1 fl (ref 78.0–100.0)
Platelets: 151 10*3/uL (ref 150.0–400.0)
RBC: 4.73 Mil/uL (ref 3.87–5.11)
RDW: 14.1 % (ref 11.5–15.5)
WBC: 8.3 10*3/uL (ref 4.0–10.5)

## 2019-05-18 LAB — COMPREHENSIVE METABOLIC PANEL
ALT: 10 U/L (ref 0–35)
AST: 11 U/L (ref 0–37)
Albumin: 3.5 g/dL (ref 3.5–5.2)
Alkaline Phosphatase: 51 U/L (ref 39–117)
BUN: 8 mg/dL (ref 6–23)
CO2: 28 mEq/L (ref 19–32)
Calcium: 8.7 mg/dL (ref 8.4–10.5)
Chloride: 106 mEq/L (ref 96–112)
Creatinine, Ser: 0.89 mg/dL (ref 0.40–1.20)
GFR: 78.47 mL/min (ref >60.00)
Glucose, Bld: 93 mg/dL (ref 70–99)
Potassium: 4.1 mEq/L (ref 3.5–5.1)
Sodium: 140 mEq/L (ref 135–145)
Total Bilirubin: 0.2 mg/dL (ref 0.2–1.2)
Total Protein: 5.5 g/dL — ABNORMAL LOW (ref 6.0–8.3)

## 2019-05-18 LAB — URINALYSIS, ROUTINE W REFLEX MICROSCOPIC
Bilirubin Urine: NEGATIVE
Hgb urine dipstick: NEGATIVE
Ketones, ur: NEGATIVE
Leukocytes,Ua: NEGATIVE
Nitrite: NEGATIVE
RBC / HPF: NONE SEEN (ref 0–?)
Specific Gravity, Urine: 1.02 (ref 1.000–1.030)
Total Protein, Urine: NEGATIVE
Urine Glucose: NEGATIVE
Urobilinogen, UA: 0.2 (ref 0.0–1.0)
pH: 7.5 (ref 5.0–8.0)

## 2019-05-18 LAB — URIC ACID: Uric Acid, Serum: 3.6 mg/dL (ref 2.4–7.0)

## 2019-05-18 LAB — TSH: TSH: 1.55 u[IU]/mL (ref 0.35–4.50)

## 2019-05-18 LAB — C-REACTIVE PROTEIN: CRP: <1 mg/dL (ref 0.5–20.0)

## 2019-05-18 LAB — SEDIMENTATION RATE: Sed Rate: 13 mm/hr (ref 0–30)

## 2019-05-18 LAB — LDL CHOLESTEROL, DIRECT: Direct LDL: 72 mg/dL

## 2019-05-18 LAB — VITAMIN D 25 HYDROXY (VIT D DEFICIENCY, FRACTURES): VITD: <7 ng/mL — ABNORMAL LOW (ref 30.00–100.00)

## 2019-05-19 LAB — HEPATITIS C ANTIBODY
Hepatitis C Ab: NONREACTIVE
SIGNAL TO CUT-OFF: 0.02 (ref <1.00)

## 2019-05-19 LAB — HIV ANTIBODY (ROUTINE TESTING W REFLEX): HIV 1&2 Ab, 4th Generation: NONREACTIVE

## 2019-05-22 DIAGNOSIS — E559 Vitamin D deficiency, unspecified: Secondary | ICD-10-CM

## 2019-05-22 HISTORY — DX: Vitamin D deficiency, unspecified: E55.9

## 2019-05-22 MED ORDER — VITAMIN D (ERGOCALCIFEROL) 1.25 MG (50000 UNIT) PO CAPS: 50000.0000 [IU] | ORAL_CAPSULE | ORAL | 6 refills | Status: DC

## 2019-05-22 NOTE — Addendum Note (Signed)
Addended by: Jon Billings on: 05/22/2019 05:34 PM   Modules accepted: Orders

## 2019-06-02 ENCOUNTER — Other Ambulatory Visit: Payer: Self-pay

## 2019-06-02 ENCOUNTER — Ambulatory Visit (INDEPENDENT_AMBULATORY_CARE_PROVIDER_SITE_OTHER): Payer: 59 | Admitting: Family Medicine

## 2019-06-02 ENCOUNTER — Encounter: Payer: Self-pay | Admitting: Family Medicine

## 2019-06-02 VITALS — BP 126/72 | HR 92 | Temp 98.6°F | Ht 64.0 in | Wt 247.8 lb

## 2019-06-02 DIAGNOSIS — J301 Allergic rhinitis due to pollen: Secondary | ICD-10-CM

## 2019-06-02 DIAGNOSIS — F418 Other specified anxiety disorders: Secondary | ICD-10-CM

## 2019-06-02 DIAGNOSIS — R252 Cramp and spasm: Secondary | ICD-10-CM | POA: Insufficient documentation

## 2019-06-02 DIAGNOSIS — E559 Vitamin D deficiency, unspecified: Secondary | ICD-10-CM

## 2019-06-02 DIAGNOSIS — Z6841 Body Mass Index (BMI) 40.0 and over, adult: Secondary | ICD-10-CM

## 2019-06-02 HISTORY — DX: Allergic rhinitis due to pollen: J30.1

## 2019-06-02 HISTORY — DX: Other specified anxiety disorders: F41.8

## 2019-06-02 HISTORY — DX: Cramp and spasm: R25.2

## 2019-06-02 MED ORDER — FLUTICASONE PROPIONATE 50 MCG/ACT NA SUSP: 2.0000 | Freq: Every day | NASAL | 6 refills | Status: DC

## 2019-06-02 MED ORDER — METHOCARBAMOL 500 MG PO TABS: 500.0000 mg | ORAL_TABLET | Freq: Three times a day (TID) | ORAL | 1 refills | Status: DC | PRN

## 2019-06-02 MED ORDER — DULOXETINE HCL 30 MG PO CPEP: 30.0000 mg | ORAL_CAPSULE | Freq: Every day | ORAL | 3 refills | Status: DC

## 2019-06-02 NOTE — Progress Notes (Signed)
Established Patient Office Visit  Subjective:  Patient ID: Sandra Stafford, female    DOB: 03-01-1960  Age: 59 y.o. MRN: [id number]  CC:  Chief Complaint  Patient presents with  . Follow-up    2 week follow up on musle aches, pt states that her sinus is bothering her    HPI Sandra Stafford presents for follow-up of postnasal drip right ear congestion sneezing.  Denies fevers chills purulent phlegm or difficulty breathing.  History of allergy rhinitis.  Has been taking NyQuil with some relief.  Ongoing history of cramps in her hands and feet.  She has taken Flexeril on a regular basis for this issue.  Tries to take the Flexeril when she can but only when she has the time to be sleeping at least 5 hours.  She has been able to start the vitamin D.  Past Medical History:  Diagnosis Date  . Allergy   . Asthma   . GERD (gastroesophageal reflux disease)     Past Surgical History:  Procedure Laterality Date  . ABDOMINAL HYSTERECTOMY      Family History  Problem Relation Age of Onset  . Diabetes Mother   . Hypertension Mother     Social History   Socioeconomic History  . Marital status: Single    Spouse name: Not on file  . Number of children: Not on file  . Years of education: Not on file  . Highest education level: Not on file  Occupational History  . Not on file  Tobacco Use  . Smoking status: Current Every Day Smoker    Packs/day: 0.50    Years: 42.00    Pack years: 21.00    Types: Cigarettes  . Smokeless tobacco: Never Used  Substance and Sexual Activity  . Alcohol use: Yes    Alcohol/week: 2.0 - 3.0 standard drinks    Types: 2 - 3 Standard drinks or equivalent per week    Comment: rarely  . Drug use: No  . Sexual activity: Not on file  Other Topics Concern  . Not on file  Social History Narrative  . Not on file   Social Determinants of Health   Financial Resource Strain:   . Difficulty of Paying Living Expenses:   Food Insecurity:   . Worried About Paediatric nurse in the Last Year:   . Arboriculturist in the Last Year:   Transportation Needs:   . Film/video editor (Medical):   Marland Kitchen Lack of Transportation (Non-Medical):   Physical Activity:   . Days of Exercise per Week:   . Minutes of Exercise per Session:   Stress:   . Feeling of Stress :   Social Connections:   . Frequency of Communication with Friends and Family:   . Frequency of Social Gatherings with Friends and Family:   . Attends Religious Services:   . Active Member of Clubs or Organizations:   . Attends Archivist Meetings:   Marland Kitchen Marital Status:   Intimate Partner Violence:   . Fear of Current or Ex-Partner:   . Emotionally Abused:   Marland Kitchen Physically Abused:   . Sexually Abused:     Outpatient Medications Prior to Visit  Medication Sig Dispense Refill  . amitriptyline (ELAVIL) 25 MG tablet Take 1 tablet (25 mg total) by mouth at bedtime as needed for sleep. 30 tablet 0  . pantoprazole (PROTONIX) 20 MG tablet Take 1 tablet (20 mg total) by mouth daily. 30 tablet  0  . Vitamin D, Ergocalciferol, (DRISDOL) 1.25 MG (50000 UNIT) CAPS capsule Take 1 capsule (50,000 Units total) by mouth every 7 (seven) days. 5 capsule 6  . cyclobenzaprine (FLEXERIL) 10 MG tablet Take 1 tablet (10 mg total) by mouth 2 (two) times daily as needed for muscle spasms. 20 tablet 0  . naproxen sodium (ANAPROX) 220 MG tablet Take 220 mg by mouth 2 (two) times daily as needed (for pain).    Marland Kitchen albuterol (PROVENTIL HFA;VENTOLIN HFA) 108 (90 BASE) MCG/ACT inhaler Inhale 2 puffs into the lungs every 6 (six) hours as needed for wheezing or shortness of breath. (Patient not taking: Reported on 05/19/2016) 1 Inhaler 0  . fluticasone (FLONASE) 50 MCG/ACT nasal spray Place 2 sprays into both nostrils daily. (Patient not taking: Reported on 05/19/2016) 16 g 6  . HYDROcodone-acetaminophen (NORCO) 5-325 MG tablet Take 1 tablet by mouth every 6 (six) hours as needed for severe pain. (Patient not taking: Reported  on 05/17/2019) 10 tablet 0  . meloxicam (MOBIC) 15 MG tablet Take 1 tablet (15 mg total) by mouth daily. (Patient not taking: Reported on 05/17/2019) 30 tablet 0   No facility-administered medications prior to visit.    Allergies  Allergen Reactions  . Dust Mite Mixed Allergen Ext [Mite (D. Farinae)]   . Pollen Extract     ROS Review of Systems  Constitutional: Positive for fatigue. Negative for chills, diaphoresis, fever and unexpected weight change.  HENT: Positive for congestion, postnasal drip and sneezing.   Eyes: Negative for photophobia and visual disturbance.  Respiratory: Negative.   Cardiovascular: Negative.   Gastrointestinal: Negative.   Musculoskeletal: Positive for arthralgias and myalgias.  Neurological: Negative for weakness and numbness.  Psychiatric/Behavioral: Positive for dysphoric mood. The patient is nervous/anxious.       Objective:    Physical Exam  Constitutional: She is oriented to person, place, and time. She appears well-developed and well-nourished. No distress.  HENT:  Head: Normocephalic and atraumatic.  Right Ear: External ear normal.  Left Ear: External ear normal.  Eyes: Pupils are equal, round, and reactive to light. Conjunctivae are normal. Right eye exhibits no discharge. Left eye exhibits no discharge. No scleral icterus.  Neck: No JVD present. No tracheal deviation present.  Cardiovascular: Normal rate, regular rhythm and normal heart sounds.  Pulmonary/Chest: Effort normal and breath sounds normal. No stridor.  Musculoskeletal:        General: No edema.     Lumbar back: Tenderness (mild ) present. Normal range of motion.     Right hip: Normal range of motion. Normal strength.     Left hip: Normal range of motion. Normal strength.  Neurological: She is alert and oriented to person, place, and time.  Skin: Skin is warm and dry. She is not diaphoretic.  Psychiatric: She has a normal mood and affect. Her behavior is normal.    BP  126/72   Pulse 92   Temp 98.6 F (37 C) (Tympanic)   Ht 5\' 4"  (1.626 m)   Wt 247 lb 12.8 oz (112.4 kg)   SpO2 95%   BMI 42.53 kg/m  Wt Readings from Last 3 Encounters:  06/02/19 247 lb 12.8 oz (112.4 kg)  05/17/19 248 lb (112.5 kg)  02/23/17 230 lb (104.3 kg)     Health Maintenance Due  Topic Date Due  . COVID-19 Vaccine (1) Never done  . TETANUS/TDAP  Never done  . PAP SMEAR-Modifier  Never done  . MAMMOGRAM  01/31/2010  . COLONOSCOPY  Never done    There are no preventive care reminders to display for this patient.  Lab Results  Component Value Date   TSH 1.55 05/17/2019   Lab Results  Component Value Date   WBC 8.3 05/17/2019   HGB 14.2 05/17/2019   HCT 42.1 05/17/2019   MCV 89.1 05/17/2019   PLT 151.0 05/17/2019   Lab Results  Component Value Date   NA 140 05/17/2019   K 4.1 05/17/2019   CO2 28 05/17/2019   GLUCOSE 93 05/17/2019   BUN 8 05/17/2019   CREATININE 0.89 05/17/2019   BILITOT 0.2 05/17/2019   ALKPHOS 51 05/17/2019   AST 11 05/17/2019   ALT 10 05/17/2019   PROT 5.5 (L) 05/17/2019   ALBUMIN 3.5 05/17/2019   CALCIUM 8.7 05/17/2019   GFR 78.47 05/17/2019   Lab Results  Component Value Date   CHOL 158 05/17/2019   Lab Results  Component Value Date   HDL 60.50 05/17/2019   Lab Results  Component Value Date   LDLCALC 68 05/17/2019   Lab Results  Component Value Date   TRIG 151.0 (H) 05/17/2019   Lab Results  Component Value Date   CHOLHDL 3 05/17/2019   No results found for: HGBA1C    Assessment & Plan:   Problem List Items Addressed This Visit      Respiratory   Seasonal allergic rhinitis due to pollen   Relevant Medications   fluticasone (FLONASE) 50 MCG/ACT nasal spray     Other   Class 3 severe obesity due to excess calories with body mass index (BMI) of 40.0 to 44.9 in adult [facility]) - Primary   Vitamin D deficiency   Depression with anxiety   Relevant Medications   DULoxetine (CYMBALTA) 30 MG capsule   Muscle  cramps   Relevant Medications   methocarbamol (ROBAXIN) 500 MG tablet      Meds ordered this encounter  Medications  . DULoxetine (CYMBALTA) 30 MG capsule    Sig: Take 1 capsule (30 mg total) by mouth daily.    Dispense:  30 capsule    Refill:  3  . methocarbamol (ROBAXIN) 500 MG tablet    Sig: Take 1 tablet (500 mg total) by mouth every 8 (eight) hours as needed for muscle spasms.    Dispense:  60 tablet    Refill:  1  . fluticasone (FLONASE) 50 MCG/ACT nasal spray    Sig: Place 2 sprays into both nostrils daily.    Dispense:  16 g    Refill:  6    Follow-up: No follow-ups on file.   Advised her to go and start a multivitamin with magnesium to help with her Cramps.  Robaxin as needed.  Agrees to go ahead and try Cymbalta.  Restart Flonase.  Urged weight loss.  Assures me that she has been able to lose weight before. Libby Maw, MD

## 2019-07-05 ENCOUNTER — Ambulatory Visit (INDEPENDENT_AMBULATORY_CARE_PROVIDER_SITE_OTHER): Payer: 59 | Admitting: Family Medicine

## 2019-07-05 ENCOUNTER — Encounter: Payer: Self-pay | Admitting: Gastroenterology

## 2019-07-05 ENCOUNTER — Other Ambulatory Visit: Payer: Self-pay

## 2019-07-05 ENCOUNTER — Encounter: Payer: Self-pay | Admitting: Family Medicine

## 2019-07-05 VITALS — BP 122/64 | HR 97 | Temp 97.6°F | Ht 64.0 in | Wt 247.6 lb

## 2019-07-05 DIAGNOSIS — H43399 Other vitreous opacities, unspecified eye: Secondary | ICD-10-CM

## 2019-07-05 DIAGNOSIS — E559 Vitamin D deficiency, unspecified: Secondary | ICD-10-CM

## 2019-07-05 DIAGNOSIS — R252 Cramp and spasm: Secondary | ICD-10-CM

## 2019-07-05 DIAGNOSIS — F418 Other specified anxiety disorders: Secondary | ICD-10-CM

## 2019-07-05 DIAGNOSIS — R011 Cardiac murmur, unspecified: Secondary | ICD-10-CM

## 2019-07-05 DIAGNOSIS — Z6841 Body Mass Index (BMI) 40.0 and over, adult: Secondary | ICD-10-CM | POA: Diagnosis not present

## 2019-07-05 DIAGNOSIS — Z0000 Encounter for general adult medical examination without abnormal findings: Secondary | ICD-10-CM

## 2019-07-05 HISTORY — DX: Cardiac murmur, unspecified: R01.1

## 2019-07-05 LAB — MAGNESIUM: Magnesium: 2 mg/dL (ref 1.5–2.5)

## 2019-07-05 MED ORDER — DULOXETINE HCL 30 MG PO CPEP: 30.0000 mg | ORAL_CAPSULE | Freq: Every day | ORAL | 3 refills | Status: DC

## 2019-07-05 NOTE — Progress Notes (Signed)
Established Patient Office Visit  Subjective:  Patient ID: Sandra Stafford, female    DOB: 1960-03-07  Age: 59 y.o. MRN: [id number]  CC:  Chief Complaint  Patient presents with  . Follow-up    1 month follow up on depression and muscle spasms, no concerns.     HPI Sandra Stafford presents for follow-up of her depression.  Tells me that the Cymbalta seems to be have been helping.  Continues to have some muscle cramps in her legs.  Robaxin seems to help.  She has been seeing floaters.  She has not had a GYN check in years.  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  She has never had a colonoscopy.  Protonix has been helping her reflux a great deal.  Past Medical History:  Diagnosis Date  . Allergy   . Asthma   . GERD (gastroesophageal reflux disease)     Past Surgical History:  Procedure Laterality Date  . ABDOMINAL HYSTERECTOMY      Family History  Problem Relation Age of Onset  . Diabetes Mother   . Hypertension Mother     Social History   Socioeconomic History  . Marital status: Single    Spouse name: Not on file  . Number of children: Not on file  . Years of education: Not on file  . Highest education level: Not on file  Occupational History  . Not on file  Tobacco Use  . Smoking status: Current Every Day Smoker    Packs/day: 0.50    Years: 42.00    Pack years: 21.00    Types: Cigarettes  . Smokeless tobacco: Never Used  Substance and Sexual Activity  . Alcohol use: Yes    Alcohol/week: 2.0 - 3.0 standard drinks    Types: 2 - 3 Standard drinks or equivalent per week    Comment: rarely  . Drug use: No  . Sexual activity: Not on file  Other Topics Concern  . Not on file  Social History Narrative  . Not on file   Social Determinants of Health   Financial Resource Strain:   . Difficulty of Paying Living Expenses:   Food Insecurity:   . Worried About Charity fundraiser in the Last Year:   . Arboriculturist in the Last Year:     Transportation Needs:   . Film/video editor (Medical):   Marland Kitchen Lack of Transportation (Non-Medical):   Physical Activity:   . Days of Exercise per Week:   . Minutes of Exercise per Session:   Stress:   . Feeling of Stress :   Social Connections:   . Frequency of Communication with Friends and Family:   . Frequency of Social Gatherings with Friends and Family:   . Attends Religious Services:   . Active Member of Clubs or Organizations:   . Attends Archivist Meetings:   Marland Kitchen Marital Status:   Intimate Partner Violence:   . Fear of Current or Ex-Partner:   . Emotionally Abused:   Marland Kitchen Physically Abused:   . Sexually Abused:     Outpatient Medications Prior to Visit  Medication Sig Dispense Refill  . fluticasone (FLONASE) 50 MCG/ACT nasal spray Place 2 sprays into both nostrils daily. 16 g 6  . methocarbamol (ROBAXIN) 500 MG tablet Take 1 tablet (500 mg total) by mouth every 8 (eight) hours as needed for muscle spasms. 60 tablet 1  . pantoprazole (PROTONIX) 20 MG tablet Take 1 tablet (  20 mg total) by mouth daily. 30 tablet 0  . Vitamin D, Ergocalciferol, (DRISDOL) 1.25 MG (50000 UNIT) CAPS capsule Take 1 capsule (50,000 Units total) by mouth every 7 (seven) days. 5 capsule 6  . DULoxetine (CYMBALTA) 30 MG capsule Take 1 capsule (30 mg total) by mouth daily. 30 capsule 3  . naproxen sodium (ANAPROX) 220 MG tablet Take 220 mg by mouth 2 (two) times daily as needed (for pain).    Marland Kitchen amitriptyline (ELAVIL) 25 MG tablet Take 1 tablet (25 mg total) by mouth at bedtime as needed for sleep. (Patient not taking: Reported on 07/05/2019) 30 tablet 0   No facility-administered medications prior to visit.    Allergies  Allergen Reactions  . Dust Mite Mixed Allergen Ext [Mite (D. Farinae)]   . Pollen Extract     ROS Review of Systems  Constitutional: Negative for fever and unexpected weight change.  Eyes: Positive for visual disturbance (floaters). Negative for photophobia.   Respiratory: Negative for chest tightness and shortness of breath.   Cardiovascular: Negative for chest pain.  Gastrointestinal: Negative for anal bleeding, blood in stool and constipation.  Endocrine: Negative for polyphagia.  Genitourinary: Negative for difficulty urinating, frequency and urgency.  Musculoskeletal: Positive for myalgias.  Skin: Negative for pallor and rash.  Allergic/Immunologic: Negative for immunocompromised state.  Neurological: Negative for light-headedness and headaches.  Hematological: Does not bruise/bleed easily.  Psychiatric/Behavioral: Negative.       Objective:    Physical Exam  Constitutional: She is oriented to person, place, and time. She appears well-developed and well-nourished. No distress.  HENT:  Head: Normocephalic and atraumatic.  Right Ear: External ear normal.  Left Ear: External ear normal.  Eyes: Conjunctivae are normal. Right eye exhibits no discharge. Left eye exhibits no discharge. No scleral icterus.  Neck: No JVD present. No tracheal deviation present.  Cardiovascular: Normal rate and regular rhythm.  Murmur heard. Pulmonary/Chest: Breath sounds normal. No stridor. No respiratory distress. She has no wheezes. She has no rales.  Abdominal: Bowel sounds are normal.  Musculoskeletal:        General: No edema.  Neurological: She is alert and oriented to person, place, and time.  Skin: Skin is warm and dry. She is not diaphoretic.  Psychiatric: She has a normal mood and affect. Her behavior is normal.    BP 122/64   Pulse 97   Temp 97.6 F (36.4 C) (Tympanic)   Ht 5\' 4"  (1.626 m)   Wt 247 lb 9.6 oz (112.3 kg)   SpO2 95%   BMI 42.50 kg/m  Wt Readings from Last 3 Encounters:  07/05/19 247 lb 9.6 oz (112.3 kg)  06/02/19 247 lb 12.8 oz (112.4 kg)  05/17/19 248 lb (112.5 kg)     Health Maintenance Due  Topic Date Due  . COVID-19 Vaccine (1) Never done  . TETANUS/TDAP  Never done  . PAP SMEAR-Modifier  Never done  .  MAMMOGRAM  01/31/2010  . COLONOSCOPY  Never done    There are no preventive care reminders to display for this patient.  Lab Results  Component Value Date   TSH 1.55 05/17/2019   Lab Results  Component Value Date   WBC 8.3 05/17/2019   HGB 14.2 05/17/2019   HCT 42.1 05/17/2019   MCV 89.1 05/17/2019   PLT 151.0 05/17/2019   Lab Results  Component Value Date   NA 140 05/17/2019   K 4.1 05/17/2019   CO2 28 05/17/2019   GLUCOSE 93 05/17/2019  BUN 8 05/17/2019   CREATININE 0.89 05/17/2019   BILITOT 0.2 05/17/2019   ALKPHOS 51 05/17/2019   AST 11 05/17/2019   ALT 10 05/17/2019   PROT 5.5 (L) 05/17/2019   ALBUMIN 3.5 05/17/2019   CALCIUM 8.7 05/17/2019   GFR 78.47 05/17/2019   Lab Results  Component Value Date   CHOL 158 05/17/2019   Lab Results  Component Value Date   HDL 60.50 05/17/2019   Lab Results  Component Value Date   LDLCALC 68 05/17/2019   Lab Results  Component Value Date   TRIG 151.0 (H) 05/17/2019   Lab Results  Component Value Date   CHOLHDL 3 05/17/2019   No results found for: HGBA1C    Assessment & Plan:   Problem List Items Addressed This Visit      Other   Class 3 severe obesity due to excess calories with body mass index (BMI) of 40.0 to 44.9 in adult [facility]) - Primary   Healthcare maintenance   Relevant Orders   Ambulatory referral to Gynecology   Ambulatory referral to Gastroenterology   Vitamin D deficiency   Depression with anxiety   Relevant Medications   DULoxetine (CYMBALTA) 30 MG capsule   Muscle cramps   Relevant Orders   Magnesium   Heart murmur   Relevant Orders   Ambulatory referral to Cardiology    Other Visit Diagnoses    Vitreous floaters, unspecified laterality       Relevant Orders   Ambulatory referral to Ophthalmology      Meds ordered this encounter  Medications  . DULoxetine (CYMBALTA) 30 MG capsule    Sig: Take 1 capsule (30 mg total) by mouth daily.    Dispense:  30 capsule    Refill:  3     Follow-up: Return in about 3 months (around 10/05/2019).    Libby Maw, MD

## 2019-07-16 ENCOUNTER — Ambulatory Visit: Payer: 59 | Admitting: Cardiology

## 2019-08-04 ENCOUNTER — Other Ambulatory Visit: Payer: Self-pay

## 2019-08-04 ENCOUNTER — Encounter: Payer: Self-pay | Admitting: Cardiology

## 2019-08-04 ENCOUNTER — Ambulatory Visit (INDEPENDENT_AMBULATORY_CARE_PROVIDER_SITE_OTHER): Payer: 59 | Admitting: Cardiology

## 2019-08-04 VITALS — BP 119/80 | HR 75 | Ht 64.0 in | Wt 248.0 lb

## 2019-08-04 DIAGNOSIS — R0609 Other forms of dyspnea: Secondary | ICD-10-CM | POA: Insufficient documentation

## 2019-08-04 DIAGNOSIS — R0600 Dyspnea, unspecified: Secondary | ICD-10-CM | POA: Diagnosis not present

## 2019-08-04 DIAGNOSIS — Z720 Tobacco use: Secondary | ICD-10-CM

## 2019-08-04 DIAGNOSIS — R011 Cardiac murmur, unspecified: Secondary | ICD-10-CM | POA: Diagnosis not present

## 2019-08-04 HISTORY — DX: Dyspnea, unspecified: R06.00

## 2019-08-04 HISTORY — DX: Other forms of dyspnea: R06.09

## 2019-08-04 NOTE — Progress Notes (Signed)
Cardiology Consultation:    Date:  08/04/2019   ID:  JANELLY SWITALSKI, DOB 1960/11/02, MRN [id number]  PCP:  Libby Maw, MD  Cardiologist:  Jenne Campus, MD   Referring MD: Libby Maw,*   Chief Complaint  Patient presents with  . New Patient (Initial Visit)    History of Present Illness:    Sandra Stafford is a 59 y.o. female who is being seen today for the evaluation of heart murmur at the request of Libby Maw,*.  Apparently her mother was told that she does have heart murmur when she was a child however her mother never told her, the patient about it.  Recently she had physical exam heart murmur was heard and she was referred to [place].  She is overall doing well.  She denies have any chest pain tightness squeezing pressure burning chest.  There is no palpitations.  She described to have shortness of breath however it is very gradual denies having any significant swelling of lower extremities.  She smokes and she knows this is a problem trying to work on quitting smoking.  Obviously she is very concerned about the fact she was told to have a heart murmur she is also upset with her mother that her mother never told her about that.  Interestingly multiple family members do have heart murmur however nobody required open heart surgery for it.  Past Medical History:  Diagnosis Date  . Allergy   . Asthma   . GERD (gastroesophageal reflux disease)     Past Surgical History:  Procedure Laterality Date  . ABDOMINAL HYSTERECTOMY      Current Medications: Current Meds  Medication Sig  . amitriptyline (ELAVIL) 25 MG tablet Take 25 mg by mouth at bedtime as needed for sleep.  . Cetirizine HCl 10 MG CAPS Take 1 capsule by mouth as needed.  . DULoxetine (CYMBALTA) 30 MG capsule Take 1 capsule (30 mg total) by mouth daily.  . fluticasone (FLONASE) 50 MCG/ACT nasal spray Place 2 sprays into both nostrils daily.  . methocarbamol (ROBAXIN) 500 MG tablet Take 1  tablet (500 mg total) by mouth every 8 (eight) hours as needed for muscle spasms.  . Multiple Vitamin (MULTIVITAMIN ADULT PO) Take 1 tablet by mouth daily.  . naproxen sodium (ANAPROX) 220 MG tablet Take 220 mg by mouth 2 (two) times daily as needed (for pain).  . Vitamin D, Ergocalciferol, (DRISDOL) 1.25 MG (50000 UNIT) CAPS capsule Take 1 capsule (50,000 Units total) by mouth every 7 (seven) days.     Allergies:   Dust mite mixed allergen ext [mite (d. farinae)] and Pollen extract   Social History   Socioeconomic History  . Marital status: Single    Spouse name: Not on file  . Number of children: Not on file  . Years of education: Not on file  . Highest education level: Not on file  Occupational History  . Not on file  Tobacco Use  . Smoking status: Current Every Day Smoker    Packs/day: 0.50    Years: 42.00    Pack years: 21.00    Types: Cigarettes  . Smokeless tobacco: Never Used  Substance and Sexual Activity  . Alcohol use: Yes    Alcohol/week: 2.0 - 3.0 standard drinks    Types: 2 - 3 Standard drinks or equivalent per week    Comment: rarely  . Drug use: No  . Sexual activity: Not on file  Other Topics Concern  .  Not on file  Social History Narrative  . Not on file   Social Determinants of Health   Financial Resource Strain:   . Difficulty of Paying Living Expenses:   Food Insecurity:   . Worried About Charity fundraiser in the Last Year:   . Arboriculturist in the Last Year:   Transportation Needs:   . Film/video editor (Medical):   Marland Kitchen Lack of Transportation (Non-Medical):   Physical Activity:   . Days of Exercise per Week:   . Minutes of Exercise per Session:   Stress:   . Feeling of Stress :   Social Connections:   . Frequency of Communication with Friends and Family:   . Frequency of Social Gatherings with Friends and Family:   . Attends Religious Services:   . Active Member of Clubs or Organizations:   . Attends Archivist Meetings:    Marland Kitchen Marital Status:      Family History: The patient's family history includes Diabetes in her mother; Hypertension in her mother. ROS:   Please see the history of present illness.    All 14 point review of systems negative except as described per history of present illness.  EKGs/Labs/Other Studies Reviewed:    The following studies were reviewed today:   EKG:  EKG is  ordered today.  The ekg ordered today demonstrates normal sinus rhythm, possible left atrial enlargement, normal QS complex duration morphology no ST segment changes  Recent Labs: 05/17/2019: ALT 10; BUN 8; Creatinine, Ser 0.89; Hemoglobin 14.2; Platelets 151.0; Potassium 4.1; Sodium 140; TSH 1.55 07/05/2019: Magnesium 2.0  Recent Lipid Panel    Component Value Date/Time   CHOL 158 05/17/2019 1444   TRIG 151.0 (H) 05/17/2019 1444   HDL 60.50 05/17/2019 1444   CHOLHDL 3 05/17/2019 1444   VLDL 30.2 05/17/2019 1444   LDLCALC 68 05/17/2019 1444   LDLDIRECT 72.0 05/17/2019 1444    Physical Exam:    VS:  BP 119/80 (BP Location: Left Arm, Patient Position: Sitting, Cuff Size: Large)   Pulse 75   Ht 5\' 4"  (1.626 m)   Wt 248 lb (112.5 kg)   SpO2 96%   BMI 42.57 kg/m     Wt Readings from Last 3 Encounters:  08/04/19 248 lb (112.5 kg)  07/05/19 247 lb 9.6 oz (112.3 kg)  06/02/19 247 lb 12.8 oz (112.4 kg)     GEN:  Well nourished, well developed in no acute distress HEENT: Normal NECK: No JVD; No carotid bruits LYMPHATICS: No lymphadenopathy CARDIAC: RRR, soft systolic murmur grade 2/6 best heard right portion of the sternum without radiation.  No rubs, no gallops RESPIRATORY:  Clear to auscultation without rales, wheezing or rhonchi  ABDOMEN: Soft, non-tender, non-distended MUSCULOSKELETAL:  No edema; No deformity  SKIN: Warm and dry NEUROLOGIC:  Alert and oriented x 3 PSYCHIATRIC:  Normal affect   ASSESSMENT:    1. Heart murmur   2. Dyspnea on exertion   3. Tobacco use    PLAN:    In order of  problems listed above:  1. Heart murmur.  It is soft systolic murmur best heard in the right upper portion of the sternum, examination somewhat difficult because of her obesity.  Echocardiogram will be done to clarify exactly what the lesion is.  Luckily she does not have much symptoms that would indicate congestive heart failure no paroxysmaldyspnea, she does have some shortness of breath with exertion but it is most likely multifactorial which include  smoking, most likely COPD as well as obesity.  Again will wait for results of echocardiogram to make any future recommendation terms of what need to be done about that. 2. Dyspnea on exertion: Multifactorial as stated above echocardiogram will be done of course to look at pulmonary artery pressure as well as valvular pathology. 3. Tobacco abuse, she understands a problem is trying to work on quitting.  We will try to help her the best we can.  We spent at least 5 minutes talking about it.   Medication Adjustments/Labs and Tests Ordered: Current medicines are reviewed at length with the patient today.  Concerns regarding medicines are outlined above.  Orders Placed This Encounter  Procedures  . ECHOCARDIOGRAM COMPLETE   No orders of the defined types were placed in this encounter.   Signed, Park Liter, MD, [facility]. 08/04/2019 4:50 PM    [facility]

## 2019-08-04 NOTE — Patient Instructions (Signed)
Medication Instructions:  Your physician recommends that you continue on your current medications as directed. Please refer to the Current Medication list given to you today.  *If you need a refill on your cardiac medications before your next appointment, please call your pharmacy*   Lab Work: NONE If you have labs (blood work) drawn today and your tests are completely normal, you will receive your results only by: Marland Kitchen MyChart Message (if you have MyChart) OR . A paper copy in the mail If you have any lab test that is abnormal or we need to change your treatment, we will call you to review the results.  Testing/Procedures: Your physician has requested that you have an echocardiogram. Echocardiography is a painless test that uses sound waves to create images of your heart. It provides your doctor with information about the size and shape of your heart and how well your heart's chambers and valves are working. This procedure takes approximately one hour. There are no restrictions for this procedure.  Follow-Up: At [facility], you and your health needs are our priority.  As part of our continuing mission to provide you with exceptional heart care, we have created designated Provider Care Teams.  These Care Teams include your primary Cardiologist (physician) and Advanced Practice Providers (APPs -  Physician Assistants and Nurse Practitioners) who all work together to provide you with the care you need, when you need it.  We recommend signing up for the patient portal called "MyChart".  Sign up information is provided on this After Visit Summary.  MyChart is used to connect with patients for Virtual Visits (Telemedicine).  Patients are able to view lab/test results, encounter notes, upcoming appointments, etc.  Non-urgent messages can be sent to your provider as well.   To learn more about what you can do with MyChart, go to [email or web address].    Your next appointment:   2 month(s)  The  format for your next appointment:   In Person  Provider:   Jenne Campus, MD

## 2019-08-06 NOTE — Addendum Note (Signed)
Addended by: Jerl Santos R on: 08/06/2019 08:57 AM   Modules accepted: Orders

## 2019-08-31 ENCOUNTER — Ambulatory Visit (INDEPENDENT_AMBULATORY_CARE_PROVIDER_SITE_OTHER): Payer: 59 | Admitting: Gastroenterology

## 2019-08-31 ENCOUNTER — Encounter: Payer: Self-pay | Admitting: Gastroenterology

## 2019-08-31 VITALS — BP 130/80 | HR 99 | Ht 64.0 in | Wt 251.2 lb

## 2019-08-31 DIAGNOSIS — R112 Nausea with vomiting, unspecified: Secondary | ICD-10-CM

## 2019-08-31 DIAGNOSIS — R1013 Epigastric pain: Secondary | ICD-10-CM

## 2019-08-31 DIAGNOSIS — K219 Gastro-esophageal reflux disease without esophagitis: Secondary | ICD-10-CM

## 2019-08-31 DIAGNOSIS — Z1211 Encounter for screening for malignant neoplasm of colon: Secondary | ICD-10-CM

## 2019-08-31 MED ORDER — CLENPIQ 10-3.5-12 MG-GM -GM/160ML PO SOLN: 1.0000 | Freq: Once | ORAL | 0 refills | Status: AC

## 2019-08-31 MED ORDER — PANTOPRAZOLE SODIUM 20 MG PO TBEC: 20.0000 mg | DELAYED_RELEASE_TABLET | Freq: Every day | ORAL | 5 refills | Status: DC

## 2019-08-31 NOTE — Patient Instructions (Signed)
We have sent the following medications to your pharmacy for you to pick up at your convenience:  Protonix ° °You have been scheduled for an endoscopy and colonoscopy. Please follow the written instructions given to you at your visit today. °Please pick up your prep supplies at the pharmacy within the next 1-3 days. °If you use inhalers (even only as needed), please bring them with you on the day of your procedure. ° ° °

## 2019-08-31 NOTE — Progress Notes (Signed)
Chief Complaint: GERD, n/v, CRC screening  Referring Provider:     Libby Maw, MD   HPI:    Sandra Stafford is a 59 y.o. female referred to the [facility]astroenterology Clinic for evaluation of reflux symptoms, nausea/vomiting, along with CRC screening.  Longstanding history of reflux characterized by regurgitation. Additionally with associated intermittent, gnawing MEG pain. Pain not a/w PO intake. All sxs worsening over last 2 years. Will have nausea and non-bloody emesis when reflux sxs severe. Worse with tomato-based sauces, spicy foods.  Previously tried to treat with baking soda, randitidine, Tums, with incomplete response.  Was recently started on Protonix 20 mg daily with good clinical response- but now just taking as needed.  No dysphagia.   Reported history of gastric ulcer approx 40 years ago, treated with Zantac with improvement.    Also due to routine CRC screening. No previous colonoscopy.  No lower GI symptoms.  Reviewed labs from 05/17/19: Normal CBC, CMP, ESR/CRP. Vit D <7--> started on Ergocalciferol  No known family history of CRC, GI malignancy, liver disease, pancreatic disease, or IBD.    Past Medical History:  Diagnosis Date  . Allergy   . Asthma   . GERD (gastroesophageal reflux disease)      Past Surgical History:  Procedure Laterality Date  . ABDOMINAL HYSTERECTOMY     Family History  Problem Relation Age of Onset  . Diabetes Mother   . Hypertension Mother    Social History   Tobacco Use  . Smoking status: Current Every Day Smoker    Packs/day: 0.50    Years: 42.00    Pack years: 21.00    Types: Cigarettes  . Smokeless tobacco: Never Used  Vaping Use  . Vaping Use: Never used  Substance Use Topics  . Alcohol use: Yes    Alcohol/week: 2.0 - 3.0 standard drinks    Types: 2 - 3 Standard drinks or equivalent per week    Comment: rarely  . Drug use: No   Current Outpatient Medications  Medication Sig Dispense Refill  .  Cetirizine HCl 10 MG CAPS Take 1 capsule by mouth as needed.    . DULoxetine (CYMBALTA) 30 MG capsule Take 1 capsule (30 mg total) by mouth daily. 30 capsule 3  . fluticasone (FLONASE) 50 MCG/ACT nasal spray Place 2 sprays into both nostrils daily. 16 g 6  . methocarbamol (ROBAXIN) 500 MG tablet Take 1 tablet (500 mg total) by mouth every 8 (eight) hours as needed for muscle spasms. 60 tablet 1  . Multiple Vitamin (MULTIVITAMIN ADULT PO) Take 1 tablet by mouth daily.    . pantoprazole (PROTONIX) 20 MG tablet Take 1 tablet (20 mg total) by mouth daily. 30 tablet 0  . Vitamin D, Ergocalciferol, (DRISDOL) 1.25 MG (50000 UNIT) CAPS capsule Take 1 capsule (50,000 Units total) by mouth every 7 (seven) days. 5 capsule 6   No current facility-administered medications for this visit.   Allergies  Allergen Reactions  . Dust Mite Mixed Allergen Ext [Mite (D. Farinae)]   . Naproxen Other (See Comments)    Abd pain   . Pollen Extract      Review of Systems: All systems reviewed and negative except where noted in HPI.     Physical Exam:    Wt Readings from Last 3 Encounters:  08/31/19 251 lb 4 oz (114 kg)  08/04/19 248 lb (112.5 kg)  07/05/19 247 lb 9.6 oz (  112.3 kg)    BP 130/80 (BP Location: Left Arm, Patient Position: Sitting, Cuff Size: Normal)   Pulse 99   Ht '5\' 4"'  (1.626 m)   Wt 251 lb 4 oz (114 kg)   BMI 43.13 kg/m  Constitutional:  Pleasant, in no acute distress. Psychiatric: Normal mood and affect. Behavior is normal. EENT: Pupils normal.  Conjunctivae are normal. No scleral icterus. Neck supple. No cervical LAD. Cardiovascular: Normal rate, regular rhythm. No edema Pulmonary/chest: Effort normal and breath sounds normal. No wheezing, rales or rhonchi. Abdominal: Soft, nondistended, nontender. Bowel sounds active throughout. There are no masses palpable. No hepatomegaly. Neurological: Alert and oriented to person place and time. Skin: Skin is warm and dry. No rashes  noted.   ASSESSMENT AND PLAN;   1) Regurgitation 2) Reflux symptoms 3) Nausea/Vomiting 4) Epigastric pain -Restart Protonix 20 mg/day.  Provided with Rx today -EGD to evaluate for erosive esophagitis, LES laxity, hiatal hernia along with evaluation for gastritis, PUD, etc. -Gastric biopsies at time of EGD -Discussed antireflux lifestyle/dietary modifications  5) Colon cancer screening -Overdue for routine, average risk, age-appropriate CRC screening -schedule colonoscopy  The indications, risks, and benefits of EGD and colonoscopy were explained to the patient in detail. Risks include but are not limited to bleeding, perforation, adverse reaction to medications, and cardiopulmonary compromise. Sequelae include but are not limited to the possibility of surgery, hospitalization, and mortality. The patient verbalized understanding and wished to proceed. All questions answered, referred to scheduler and bowel prep ordered. Further recommendations pending results of the exam.     Lavena Bullion, DO, FACG  08/31/2019, 11:43 AM   Libby Maw,*

## 2019-09-01 ENCOUNTER — Telehealth: Payer: Self-pay

## 2019-09-01 NOTE — Telephone Encounter (Signed)
Called patient and LVM  Patient can come to the [facility] location to pick up the clenpiq if it is too expensive from the pharmacy

## 2019-09-03 ENCOUNTER — Encounter: Payer: Self-pay | Admitting: Gastroenterology

## 2019-09-09 NOTE — Telephone Encounter (Signed)
Called patient and told her that she needs to go ahead and pick up medication for prep from the pharmacy because sometimes insurance isn't paying. She said she will go ahead and get it

## 2019-09-10 NOTE — Telephone Encounter (Signed)
Prep switched to miralax and patient is informed and will come to pick up instructions on Tuesday the 18th at the [place] office

## 2019-09-17 ENCOUNTER — Other Ambulatory Visit: Payer: Self-pay

## 2019-09-17 ENCOUNTER — Ambulatory Visit (HOSPITAL_BASED_OUTPATIENT_CLINIC_OR_DEPARTMENT_OTHER)
Admission: RE | Admit: 2019-09-17 | Discharge: 2019-09-17 | Disposition: A | Discharge status: Home / Self Care | Payer: 59 | Source: Ambulatory Visit | Attending: Cardiology | Admitting: Cardiology

## 2019-09-17 DIAGNOSIS — R011 Cardiac murmur, unspecified: Secondary | ICD-10-CM | POA: Diagnosis present

## 2019-09-17 LAB — ECHOCARDIOGRAM COMPLETE
AR max vel: 1.78 cm2
AV Area VTI: 2.01 cm2
AV Area mean vel: 1.97 cm2
AV Mean grad: 8 mmHg
AV Peak grad: 17.3 mmHg
Ao pk vel: 2.08 m/s
Area-P 1/2: 2.96 cm2
Calc EF: 53.9 %
P 1/2 time: 364 msec
S' Lateral: 3.56 cm
Single Plane A2C EF: 51.9 %
Single Plane A4C EF: 50.9 %

## 2019-09-20 ENCOUNTER — Ambulatory Visit (AMBULATORY_SURGERY_CENTER): Payer: 59 | Admitting: Gastroenterology

## 2019-09-20 ENCOUNTER — Other Ambulatory Visit: Payer: Self-pay

## 2019-09-20 ENCOUNTER — Encounter: Payer: Self-pay | Admitting: Gastroenterology

## 2019-09-20 VITALS — BP 147/75 | HR 71 | Temp 98.0°F | Resp 16 | Ht 64.0 in | Wt 251.0 lb

## 2019-09-20 DIAGNOSIS — K621 Rectal polyp: Secondary | ICD-10-CM

## 2019-09-20 DIAGNOSIS — D120 Benign neoplasm of cecum: Secondary | ICD-10-CM

## 2019-09-20 DIAGNOSIS — K2970 Gastritis, unspecified, without bleeding: Secondary | ICD-10-CM | POA: Diagnosis not present

## 2019-09-20 DIAGNOSIS — Z1211 Encounter for screening for malignant neoplasm of colon: Secondary | ICD-10-CM | POA: Diagnosis not present

## 2019-09-20 DIAGNOSIS — B9681 Helicobacter pylori [H. pylori] as the cause of diseases classified elsewhere: Secondary | ICD-10-CM

## 2019-09-20 DIAGNOSIS — K2950 Unspecified chronic gastritis without bleeding: Secondary | ICD-10-CM | POA: Diagnosis not present

## 2019-09-20 DIAGNOSIS — K5730 Diverticulosis of large intestine without perforation or abscess without bleeding: Secondary | ICD-10-CM

## 2019-09-20 DIAGNOSIS — D122 Benign neoplasm of ascending colon: Secondary | ICD-10-CM | POA: Diagnosis not present

## 2019-09-20 DIAGNOSIS — K219 Gastro-esophageal reflux disease without esophagitis: Secondary | ICD-10-CM

## 2019-09-20 DIAGNOSIS — D128 Benign neoplasm of rectum: Secondary | ICD-10-CM

## 2019-09-20 DIAGNOSIS — D124 Benign neoplasm of descending colon: Secondary | ICD-10-CM | POA: Diagnosis not present

## 2019-09-20 DIAGNOSIS — K2990 Gastroduodenitis, unspecified, without bleeding: Secondary | ICD-10-CM

## 2019-09-20 MED ORDER — SODIUM CHLORIDE 0.9 % IV SOLN: 500.0000 mL | Freq: Once | INTRAVENOUS | Status: DC

## 2019-09-20 NOTE — Progress Notes (Signed)
To PACU, VSS. Report to [email or web address] 

## 2019-09-20 NOTE — Progress Notes (Signed)
VS by CW. ?

## 2019-09-20 NOTE — Patient Instructions (Signed)
Please read handouts provided. Continue present medications. Await pathology results. Return to GI clinic as needed.    YOU HAD AN ENDOSCOPIC PROCEDURE TODAY AT THE [facility]:   Refer to the procedure report that was given to you for any specific questions about what was found during the examination.  If the procedure report does not answer your questions, please call your gastroenterologist to clarify.  If you requested that your care partner not be given the details of your procedure findings, then the procedure report has been included in a sealed envelope for you to review at your convenience later.  YOU SHOULD EXPECT: Some feelings of bloating in the abdomen. Passage of more gas than usual.  Walking can help get rid of the air that was put into your GI tract during the procedure and reduce the bloating. If you had a lower endoscopy (such as a colonoscopy or flexible sigmoidoscopy) you may notice spotting of blood in your stool or on the toilet paper. If you underwent a bowel prep for your procedure, you may not have a normal bowel movement for a few days.  Please Note:  You might notice some irritation and congestion in your nose or some drainage.  This is from the oxygen used during your procedure.  There is no need for concern and it should clear up in a day or so.  SYMPTOMS TO REPORT IMMEDIATELY:  Following lower endoscopy (colonoscopy or flexible sigmoidoscopy):  Excessive amounts of blood in the stool  Significant tenderness or worsening of abdominal pains  Swelling of the abdomen that is new, acute  Fever of 100F or higher  Following upper endoscopy (EGD)  Vomiting of blood or coffee ground material  New chest pain or pain under the shoulder blades  Painful or persistently difficult swallowing  New shortness of breath  Fever of 100F or higher  Black, tarry-looking stools  For urgent or emergent issues, a gastroenterologist can be reached at any hour by calling  [phone]. Do not use MyChart messaging for urgent concerns.    DIET:  We do recommend a small meal at first, but then you may proceed to your regular diet.  Drink plenty of fluids but you should avoid alcoholic beverages for 24 hours.  ACTIVITY:  You should plan to take it easy for the rest of today and you should NOT DRIVE or use heavy machinery until tomorrow (because of the sedation medicines used during the test).    FOLLOW UP: Our staff will call the number listed on your records 48-72 hours following your procedure to check on you and address any questions or concerns that you may have regarding the information given to you following your procedure. If we do not reach you, we will leave a message.  We will attempt to reach you two times.  During this call, we will ask if you have developed any symptoms of COVID 19. If you develop any symptoms (ie: fever, flu-like symptoms, shortness of breath, cough etc.) before then, please call [phone].  If you test positive for Covid 19 in the 2 weeks post procedure, please call and report this information to us.    If any biopsies were taken you will be contacted by phone or by letter within the next 1-3 weeks.  Please call us at [phone] if you have not heard about the biopsies in 3 weeks.    SIGNATURES/CONFIDENTIALITY: You and/or your care partner have signed paperwork which will be entered into   will be entered into your electronic medical record.  These signatures attest to the fact that that the information above on your After Visit Summary has been reviewed and is understood.  Full responsibility of the confidentiality of this discharge information lies with you and/or your care-partner.

## 2019-09-20 NOTE — Progress Notes (Signed)
Called to room to assist during endoscopic procedure.  Patient ID and intended procedure confirmed with present staff. Received instructions for my participation in the procedure from the performing physician.  

## 2019-09-20 NOTE — Op Note (Signed)
[place] Patient Name: Sandra Stafford Procedure Date: 09/20/2019 12:51 PM MRN: [id number] Endoscopist: Gerrit Heck , MD Age: 59 Referring MD:  Date of Birth: 12/15/1960 Gender: Female Account #: [id number] Procedure:                Upper GI endoscopy Indications:              Epigastric abdominal pain, Suspected esophageal                            reflux, Nausea with vomiting Medicines:                Monitored Anesthesia Care Procedure:                Pre-Anesthesia Assessment:                           - Prior to the procedure, a History and Physical                            was performed, and patient medications and                            allergies were reviewed. The patient's tolerance of                            previous anesthesia was also reviewed. The risks                            and benefits of the procedure and the sedation                            options and risks were discussed with the patient.                            All questions were answered, and informed consent                            was obtained. Prior Anticoagulants: The patient has                            taken no previous anticoagulant or antiplatelet                            agents. ASA Grade Assessment: III - A patient with                            severe systemic disease. After reviewing the risks                            and benefits, the patient was deemed in                            satisfactory condition to undergo the procedure.  After obtaining informed consent, the endoscope was                            passed under direct vision. Throughout the                            procedure, the patient's blood pressure, pulse, and                            oxygen saturations were monitored continuously. The                            Endoscope was introduced through the mouth, and                            advanced to the second part  of duodenum. The upper                            GI endoscopy was accomplished without difficulty.                            The patient tolerated the procedure well. Scope In: Scope Out: Findings:                 The examined esophagus was normal.                           Segmental mild inflammation characterized by                            erythema was found in the gastric body and in the                            gastric antrum. Biopsies were taken with a cold                            forceps for Helicobacter pylori testing. Estimated                            blood loss was minimal.                           The duodenal bulb, first portion of the duodenum                            and second portion of the duodenum were normal. Complications:            No immediate complications. Estimated Blood Loss:     Estimated blood loss was minimal. Impression:               - Normal esophagus.                           - Gastritis. Biopsied.                           -  Normal duodenal bulb, first portion of the                            duodenum and second portion of the duodenum. Recommendation:           - Patient has a contact number available for                            emergencies. The signs and symptoms of potential                            delayed complications were discussed with the                            patient. Return to normal activities tomorrow.                            Written discharge instructions were provided to the                            patient.                           - Resume previous diet.                           - Continue present medications.                           - Await pathology results. Gerrit Heck, MD 09/20/2019 2:07:01 PM

## 2019-09-20 NOTE — Op Note (Signed)
[place] Patient Name: Sandra Stafford Procedure Date: 09/20/2019 12:50 PM MRN: [id number] Endoscopist: Gerrit Heck , MD Age: 59 Referring MD:  Date of Birth: 12/31/1960 Gender: Female Account #: [id number] Procedure:                Colonoscopy Indications:              Screening for colorectal malignant neoplasm, This                            is the patient's first colonoscopy Medicines:                Monitored Anesthesia Care Procedure:                Pre-Anesthesia Assessment:                           - Prior to the procedure, a History and Physical                            was performed, and patient medications and                            allergies were reviewed. The patient's tolerance of                            previous anesthesia was also reviewed. The risks                            and benefits of the procedure and the sedation                            options and risks were discussed with the patient.                            All questions were answered, and informed consent                            was obtained. Prior Anticoagulants: The patient has                            taken no previous anticoagulant or antiplatelet                            agents. ASA Grade Assessment: III - A patient with                            severe systemic disease. After reviewing the risks                            and benefits, the patient was deemed in                            satisfactory condition to undergo the procedure.  After obtaining informed consent, the colonoscope                            was passed under direct vision. Throughout the                            procedure, the patient's blood pressure, pulse, and                            oxygen saturations were monitored continuously. The                            Colonoscope was introduced through the anus and                            advanced to the the  cecum, identified by                            appendiceal orifice and ileocecal valve. The                            colonoscopy was performed without difficulty. The                            patient tolerated the procedure well. The quality                            of the bowel preparation was good. The ileocecal                            valve, appendiceal orifice, and rectum were                            photographed. Scope In: 1:35:52 PM Scope Out: 1:57:18 PM Scope Withdrawal Time: 0 hours 17 minutes 37 seconds  Total Procedure Duration: 0 hours 21 minutes 26 seconds  Findings:                 The perianal and digital rectal examinations were                            normal.                           Three sessile polyps were found in the ascending                            colon (2) and cecum. The polyps were 2 to 3 mm in                            size. These polyps were removed with a cold biopsy                            forceps. Resection and retrieval were complete.  Estimated blood loss was minimal.                           A 5 mm polyp was found in the descending colon. The                            polyp was sessile. The polyp was removed with a                            cold snare. Resection and retrieval were complete.                            Estimated blood loss was minimal.                           A 3 mm polyp was found in the rectum. The polyp was                            sessile. The polyp was removed with a cold biopsy                            forceps. Resection and retrieval were complete.                            Estimated blood loss was minimal.                           Multiple small and large-mouthed diverticula were                            found in the sigmoid colon, descending colon and                            ascending colon.                           The retroflexed view of the distal rectum and  anal                            verge was normal and showed no anal or rectal                            abnormalities. Complications:            No immediate complications. Estimated Blood Loss:     Estimated blood loss was minimal. Impression:               - Three 2 to 3 mm polyps in the ascending colon and                            in the cecum, removed with a cold biopsy forceps.                            Resected and retrieved.                           -  One 5 mm polyp in the descending colon, removed                            with a cold snare. Resected and retrieved.                           - One 3 mm polyp in the rectum, removed with a cold                            biopsy forceps. Resected and retrieved.                           - Diverticulosis in the sigmoid colon, in the                            descending colon and in the ascending colon.                           - The distal rectum and anal verge are normal on                            retroflexion view. Recommendation:           - Patient has a contact number available for                            emergencies. The signs and symptoms of potential                            delayed complications were discussed with the                            patient. Return to normal activities tomorrow.                            Written discharge instructions were provided to the                            patient.                           - Resume previous diet.                           - Continue present medications.                           - Await pathology results.                           - Repeat colonoscopy for surveillance based on                            pathology results.                           -  Return to GI office PRN. Gerrit Heck, MD 09/20/2019 2:10:50 PM

## 2019-09-22 ENCOUNTER — Telehealth: Payer: Self-pay | Admitting: *Deleted

## 2019-09-22 NOTE — Telephone Encounter (Signed)
°  Follow up Call-  Call back number 09/20/2019  Post procedure Call Back phone  # [phone]  Permission to leave phone message Yes  Some recent data might be hidden     Patient questions:  Do you have a fever, pain , or abdominal swelling? No. Pain Score  0 *  Have you tolerated food without any problems? Yes.    Have you been able to return to your normal activities? Yes.    Do you have any questions about your discharge instructions: Diet   No. Medications  No. Follow up visit  No.  Do you have questions or concerns about your Care? No.  Actions: * If pain score is 4 or above: No action needed, pain <4.  1. Have you developed a fever since your procedure? no  2.   Have you had an respiratory symptoms (SOB or cough) since your procedure? no  3.   Have you tested positive for COVID 19 since your procedure no  4.   Have you had any family members/close contacts diagnosed with the COVID 19 since your procedure?  no   If yes to any of these questions please route to Joylene John, RN and Joella Prince, RN

## 2019-09-22 NOTE — Telephone Encounter (Signed)
Message left

## 2019-09-27 ENCOUNTER — Encounter: Payer: Self-pay | Admitting: Gastroenterology

## 2019-09-30 ENCOUNTER — Other Ambulatory Visit: Payer: Self-pay

## 2019-09-30 DIAGNOSIS — K2970 Gastritis, unspecified, without bleeding: Secondary | ICD-10-CM

## 2019-09-30 MED ORDER — DOXYCYCLINE HYCLATE 100 MG PO CAPS: 100.0000 mg | ORAL_CAPSULE | Freq: Two times a day (BID) | ORAL | 0 refills | Status: DC

## 2019-09-30 MED ORDER — METRONIDAZOLE 250 MG PO TABS: 250.0000 mg | ORAL_TABLET | Freq: Four times a day (QID) | ORAL | 0 refills | Status: DC

## 2019-10-01 ENCOUNTER — Encounter: Payer: Self-pay | Admitting: Cardiology

## 2019-10-01 ENCOUNTER — Other Ambulatory Visit: Payer: Self-pay

## 2019-10-01 ENCOUNTER — Ambulatory Visit (INDEPENDENT_AMBULATORY_CARE_PROVIDER_SITE_OTHER): Payer: 59 | Admitting: Cardiology

## 2019-10-01 VITALS — BP 130/80 | HR 82 | Ht 64.0 in | Wt 258.0 lb

## 2019-10-01 DIAGNOSIS — R0609 Other forms of dyspnea: Secondary | ICD-10-CM

## 2019-10-01 DIAGNOSIS — R011 Cardiac murmur, unspecified: Secondary | ICD-10-CM | POA: Diagnosis not present

## 2019-10-01 DIAGNOSIS — Z720 Tobacco use: Secondary | ICD-10-CM

## 2019-10-01 DIAGNOSIS — I350 Nonrheumatic aortic (valve) stenosis: Secondary | ICD-10-CM | POA: Insufficient documentation

## 2019-10-01 DIAGNOSIS — R0600 Dyspnea, unspecified: Secondary | ICD-10-CM | POA: Diagnosis not present

## 2019-10-01 HISTORY — DX: Nonrheumatic aortic (valve) stenosis: I35.0

## 2019-10-01 NOTE — Patient Instructions (Signed)

## 2019-10-01 NOTE — Progress Notes (Signed)
Cardiology Office Note:    Date:  10/01/2019   ID:  Sandra Stafford, DOB 1960-03-01, MRN [id number]  PCP:  Libby Maw, MD  Cardiologist:  Jenne Campus, MD    Referring MD: Libby Maw,*   Chief Complaint  Patient presents with  . Follow-up  I am doing fine  History of Present Illness:    Sandra Stafford is a 59 y.o. female who was referred to [place] because of heart murmur.  Also she was having dyspnea on exertion.  She complained about dyspnea on exertion for many years she said for about 40 years and she realized a part of the problem is the fact that she continues to smoke.  She did have echocardiogram done which showed preserved left ventricle ejection fraction, but that she did have mild aortic insufficiency as well as mild aortic stenosis which is most likely responsible for her heart murmur.  Denies have any palpitations no dizziness no swelling of lower extremities no passing out.  Past Medical History:  Diagnosis Date  . Allergy   . Anxiety   . Asthma   . Depression   . GERD (gastroesophageal reflux disease)     Past Surgical History:  Procedure Laterality Date  . ABDOMINAL HYSTERECTOMY      Current Medications: Current Meds  Medication Sig  . Cetirizine HCl 10 MG CAPS Take 1 capsule by mouth as needed.  . DULoxetine (CYMBALTA) 30 MG capsule Take 1 capsule (30 mg total) by mouth daily.  . fluticasone (FLONASE) 50 MCG/ACT nasal spray Place 2 sprays into both nostrils daily.  . methocarbamol (ROBAXIN) 500 MG tablet Take 1 tablet (500 mg total) by mouth every 8 (eight) hours as needed for muscle spasms.  . Multiple Vitamin (MULTIVITAMIN ADULT PO) Take 1 tablet by mouth daily.  . pantoprazole (PROTONIX) 20 MG tablet Take 1 tablet (20 mg total) by mouth daily.  . Vitamin D, Ergocalciferol, (DRISDOL) 1.25 MG (50000 UNIT) CAPS capsule Take 1 capsule (50,000 Units total) by mouth every 7 (seven) days.  . [DISCONTINUED] metroNIDAZOLE (FLAGYL) 250 MG tablet  Take 1 tablet (250 mg total) by mouth 4 (four) times daily for 14 days.     Allergies:   Dust mite mixed allergen ext [mite (d. farinae)], Naproxen, and Pollen extract   Social History   Socioeconomic History  . Marital status: Single    Spouse name: Not on file  . Number of children: Not on file  . Years of education: Not on file  . Highest education level: Not on file  Occupational History  . Not on file  Tobacco Use  . Smoking status: Current Every Day Smoker    Packs/day: 0.50    Years: 42.00    Pack years: 21.00    Types: Cigarettes  . Smokeless tobacco: Never Used  Vaping Use  . Vaping Use: Never used  Substance and Sexual Activity  . Alcohol use: Yes    Alcohol/week: 2.0 - 3.0 standard drinks    Types: 2 - 3 Standard drinks or equivalent per week    Comment: rarely  . Drug use: No  . Sexual activity: Not on file  Other Topics Concern  . Not on file  Social History Narrative  . Not on file   Social Determinants of Health   Financial Resource Strain:   . Difficulty of Paying Living Expenses: Not on file  Food Insecurity:   . Worried About Charity fundraiser in the Last Year: Not  on file  . Ran Out of Food in the Last Year: Not on file  Transportation Needs:   . Lack of Transportation (Medical): Not on file  . Lack of Transportation (Non-Medical): Not on file  Physical Activity:   . Days of Exercise per Week: Not on file  . Minutes of Exercise per Session: Not on file  Stress:   . Feeling of Stress : Not on file  Social Connections:   . Frequency of Communication with Friends and Family: Not on file  . Frequency of Social Gatherings with Friends and Family: Not on file  . Attends Religious Services: Not on file  . Active Member of Clubs or Organizations: Not on file  . Attends Archivist Meetings: Not on file  . Marital Status: Not on file     Family History: The patient's family history includes Colon cancer in her cousin; Diabetes in her  mother; Hypertension in her mother. There is no history of Esophageal cancer, Rectal cancer, or Stomach cancer. ROS:   Please see the history of present illness.    All 14 point review of systems negative except as described per history of present illness  EKGs/Labs/Other Studies Reviewed:      Recent Labs: 05/17/2019: ALT 10; BUN 8; Creatinine, Ser 0.89; Hemoglobin 14.2; Platelets 151.0; Potassium 4.1; Sodium 140; TSH 1.55 07/05/2019: Magnesium 2.0  Recent Lipid Panel    Component Value Date/Time   CHOL 158 05/17/2019 1444   TRIG 151.0 (H) 05/17/2019 1444   HDL 60.50 05/17/2019 1444   CHOLHDL 3 05/17/2019 1444   VLDL 30.2 05/17/2019 1444   LDLCALC 68 05/17/2019 1444   LDLDIRECT 72.0 05/17/2019 1444    Physical Exam:    VS:  BP 130/80 (BP Location: Right Arm, Patient Position: Sitting, Cuff Size: Normal)   Pulse 82   Ht 5\' 4"  (1.626 m)   Wt 258 lb (117 kg)   SpO2 96%   BMI 44.29 kg/m     Wt Readings from Last 3 Encounters:  10/01/19 258 lb (117 kg)  09/20/19 251 lb (113.9 kg)  08/31/19 251 lb 4 oz (114 kg)     GEN:  Well nourished, well developed in no acute distress HEENT: Normal NECK: No JVD; No carotid bruits LYMPHATICS: No lymphadenopathy CARDIAC: RRR, no murmurs, no rubs, no gallops RESPIRATORY:  Clear to auscultation without rales, wheezing or rhonchi  ABDOMEN: Soft, non-tender, non-distended MUSCULOSKELETAL:  No edema; No deformity  SKIN: Warm and dry LOWER EXTREMITIES: no swelling NEUROLOGIC:  Alert and oriented x 3 PSYCHIATRIC:  Normal affect   ASSESSMENT:    1. Dyspnea on exertion   2. Heart murmur   3. Nonrheumatic aortic valve stenosis   4. Tobacco use    PLAN:    In order of problems listed above:  1. Dyspnea on exertion.  Doing well from that point review.  Of course we discussed the issue of need to quit smoking she understands she will try to do that. 2. Heart murmur mild aortic stenosis, does not require any intervention right now, and  also there is no known medical therapy that could slow down the progression of the problem.  Overall is very mild, there is no restriction in any activities at that stage.  This is aortic stenosis stage B.  Continue monitoring.  She will require echocardiogram every few years or if symptoms change more often. 3. Tobacco abuse we spent a great of time talking about this and strongly recommend to quit.  She understands she is trying to work on that. 4. Dyslipidemia I did review her fasting lipid profile today K PN showing the HDL of 60 LDL 68 triglycerides 151 that is a good cholesterol profile.  There is no need to treat at this stage.   Medication Adjustments/Labs and Tests Ordered: Current medicines are reviewed at length with the patient today.  Concerns regarding medicines are outlined above.  No orders of the defined types were placed in this encounter.  Medication changes: No orders of the defined types were placed in this encounter.   Signed, Park Liter, MD, [facility] 10/01/2019 11:48 AM    [place]

## 2019-10-20 ENCOUNTER — Ambulatory Visit (INDEPENDENT_AMBULATORY_CARE_PROVIDER_SITE_OTHER): Payer: 59 | Admitting: Family Medicine

## 2019-10-20 ENCOUNTER — Encounter: Payer: Self-pay | Admitting: Family Medicine

## 2019-10-20 ENCOUNTER — Other Ambulatory Visit: Payer: Self-pay

## 2019-10-20 VITALS — BP 132/72 | HR 87 | Temp 97.6°F | Ht 64.0 in | Wt 254.0 lb

## 2019-10-20 DIAGNOSIS — Z23 Encounter for immunization: Secondary | ICD-10-CM

## 2019-10-20 DIAGNOSIS — F418 Other specified anxiety disorders: Secondary | ICD-10-CM | POA: Diagnosis not present

## 2019-10-20 DIAGNOSIS — Z720 Tobacco use: Secondary | ICD-10-CM | POA: Diagnosis not present

## 2019-10-20 DIAGNOSIS — E559 Vitamin D deficiency, unspecified: Secondary | ICD-10-CM

## 2019-10-20 DIAGNOSIS — T887XXA Unspecified adverse effect of drug or medicament, initial encounter: Secondary | ICD-10-CM | POA: Insufficient documentation

## 2019-10-20 HISTORY — DX: Unspecified adverse effect of drug or medicament, initial encounter: T88.7XXA

## 2019-10-20 HISTORY — DX: Encounter for immunization: Z23

## 2019-10-20 LAB — VITAMIN D 25 HYDROXY (VIT D DEFICIENCY, FRACTURES): VITD: 27.35 ng/mL — ABNORMAL LOW (ref 30.00–100.00)

## 2019-10-20 MED ORDER — FLUOXETINE HCL 10 MG PO CAPS: ORAL_CAPSULE | ORAL | 1 refills | Status: DC

## 2019-10-20 NOTE — Patient Instructions (Addendum)
Steps to Quit Smoking Smoking tobacco is the leading cause of preventable death. It can affect almost every organ in the body. Smoking puts you and people around you at risk for many serious, long-lasting (chronic) diseases. Quitting smoking can be hard, but it is one of the best things that you can do for your health. It is never too late to quit. How do I get ready to quit? When you decide to quit smoking, make a plan to help you succeed. Before you quit:  Pick a date to quit. Set a date within the next 2 weeks to give you time to prepare.  Write down the reasons why you are quitting. Keep this list in places where you will see it often.  Tell your family, friends, and co-workers that you are quitting. Their support is important.  Talk with your doctor about the choices that may help you quit.  Find out if your health insurance will pay for these treatments.  Know the people, places, things, and activities that make you want to smoke (triggers). Avoid them. What first steps can I take to quit smoking?  Throw away all cigarettes at home, at work, and in your car.  Throw away the things that you use when you smoke, such as ashtrays and lighters.  Clean your car. Make sure to empty the ashtray.  Clean your home, including curtains and carpets. What can I do to help me quit smoking? Talk with your doctor about taking medicines and seeing a counselor at the same time. You are more likely to succeed when you do both.  If you are pregnant or breastfeeding, talk with your doctor about counseling or other ways to quit smoking. Do not take medicine to help you quit smoking unless your doctor tells you to do so. To quit smoking: Quit right away  Quit smoking totally, instead of slowly cutting back on how much you smoke over a period of time.  Go to counseling. You are more likely to quit if you go to counseling sessions regularly. Take medicine You may take medicines to help you quit. Some  medicines need a prescription, and some you can buy over-the-counter. Some medicines may contain a drug called nicotine to replace the nicotine in cigarettes. Medicines may:  Help you to stop having the desire to smoke (cravings).  Help to stop the problems that come when you stop smoking (withdrawal symptoms). Your doctor may ask you to use:  Nicotine patches, gum, or lozenges.  Nicotine inhalers or sprays.  Non-nicotine medicine that is taken by mouth. Find resources Find resources and other ways to help you quit smoking and remain smoke-free after you quit. These resources are most helpful when you use them often. They include:  Online chats with a Social worker.  Phone quitlines.  Printed Furniture conservator/restorer.  Support groups or group counseling.  Text messaging programs.  Mobile phone apps. Use apps on your mobile phone or tablet that can help you stick to your quit plan. There are many free apps for mobile phones and tablets as well as websites. Examples include Quit Guide from the [employer] and smokefree.gov  What things can I do to make it easier to quit?   Talk to your family and friends. Ask them to support and encourage you.  Call a phone quitline (1-800-QUIT-NOW), reach out to support groups, or work with a Social worker.  Ask people who smoke to not smoke around you.  Avoid places that make you want to smoke,  such as: ? Bars. ? Parties. ? Smoke-break areas at work.  Spend time with people who do not smoke.  Lower the stress in your life. Stress can make you want to smoke. Try these things to help your stress: ? Getting regular exercise. ? Doing deep-breathing exercises. ? Doing yoga. ? Meditating. ? Doing a body scan. To do this, close your eyes, focus on one area of your body at a time from head to toe. Notice which parts of your body are tense. Try to relax the muscles in those areas. How will I feel when I quit smoking? Day 1 to 3 weeks Within the first 24 hours,  you may start to have some problems that come from quitting tobacco. These problems are very bad 2-3 days after you quit, but they do not often last for more than 2-3 weeks. You may get these symptoms:  Mood swings.  Feeling restless, nervous, angry, or annoyed.  Trouble concentrating.  Dizziness.  Strong desire for high-sugar foods and nicotine.  Weight gain.  Trouble pooping (constipation).  Feeling like you may vomit (nausea).  Coughing or a sore throat.  Changes in how the medicines that you take for other issues work in your body.  Depression.  Trouble sleeping (insomnia). Week 3 and afterward After the first 2-3 weeks of quitting, you may start to notice more positive results, such as:  Better sense of smell and taste.  Less coughing and sore throat.  Slower heart rate.  Lower blood pressure.  Clearer skin.  Better breathing.  Fewer sick days. Quitting smoking can be hard. Do not give up if you fail the first time. Some people need to try a few times before they succeed. Do your best to stick to your quit plan, and talk with your doctor if you have any questions or concerns. Summary  Smoking tobacco is the leading cause of preventable death. Quitting smoking can be hard, but it is one of the best things that you can do for your health.  When you decide to quit smoking, make a plan to help you succeed.  Quit smoking right away, not slowly over a period of time.  When you start quitting, seek help from your doctor, family, or friends. This information is not intended to replace advice given to you by your health care provider. Make sure you discuss any questions you have with your health care provider. Document Revised: 10/09/2018 Document Reviewed: 04/04/2018 Elsevier Patient Education  [place] with Quitting Smoking  Quitting smoking is a physical and mental challenge. You will face cravings, withdrawal symptoms, and temptation. Before  quitting, work with your health care provider to make a plan that can help you cope. Preparation can help you quit and keep you from giving in. How can I cope with cravings? Cravings usually last for 5-10 minutes. If you get through it, the craving will pass. Consider taking the following actions to help you cope with cravings:  Keep your mouth busy: ? Chew sugar-free gum. ? Suck on hard candies or a straw. ? Brush your teeth.  Keep your hands and body busy: ? Immediately change to a different activity when you feel a craving. ? Squeeze or play with a ball. ? Do an activity or a hobby, like making bead jewelry, practicing needlepoint, or working with wood. ? Mix up your normal routine. ? Take a short exercise break. Go for a quick walk or run up and down stairs. ? Spend  time in public places where smoking is not allowed.  Focus on doing something kind or helpful for someone else.  Call a friend or family member to talk during a craving.  Join a support group.  Call a quit line, such as 1-800-QUIT-NOW.  Talk with your health care provider about medicines that might help you cope with cravings and make quitting easier for you. How can I deal with withdrawal symptoms? Your body may experience negative effects as it tries to get used to not having nicotine in the system. These effects are called withdrawal symptoms. They may include:  Feeling hungrier than normal.  Trouble concentrating.  Irritability.  Trouble sleeping.  Feeling depressed.  Restlessness and agitation.  Craving a cigarette. To manage withdrawal symptoms:  Avoid places, people, and activities that trigger your cravings.  Remember why you want to quit.  Get plenty of sleep.  Avoid coffee and other caffeinated drinks. These may worsen some of your symptoms. How can I handle social situations? Social situations can be difficult when you are quitting smoking, especially in the first few weeks. To manage  this, you can:  Avoid parties, bars, and other social situations where people might be smoking.  Avoid alcohol.  Leave right away if you have the urge to smoke.  Explain to your family and friends that you are quitting smoking. Ask for understanding and support.  Plan activities with friends or family where smoking is not an option. What are some ways I can cope with stress? Wanting to smoke may cause stress, and stress can make you want to smoke. Find ways to manage your stress. Relaxation techniques can help. For example:  Breathe slowly and deeply, in through your nose and out through your mouth.  Listen to soothing, relaxing music.  Talk with a family member or friend about your stress.  Light a candle.  Soak in a bath or take a shower.  Think about a peaceful place. What are some ways I can prevent weight gain? Be aware that many people gain weight after they quit smoking. However, not everyone does. To keep from gaining weight, have a plan in place before you quit and stick to the plan after you quit. Your plan should include:  Having healthy snacks. When you have a craving, it may help to: ? Eat plain popcorn, crunchy carrots, celery, or other cut vegetables. ? Chew sugar-free gum.  Changing how you eat: ? Eat small portion sizes at meals. ? Eat 4-6 small meals throughout the day instead of 1-2 large meals a day. ? Be mindful when you eat. Do not watch television or do other things that might distract you as you eat.  Exercising regularly: ? Make time to exercise each day. If you do not have time for a long workout, do short bouts of exercise for 5-10 minutes several times a day. ? Do some form of strengthening exercise, like weight lifting, and some form of aerobic exercise, like running or swimming.  Drinking plenty of water or other low-calorie or no-calorie drinks. Drink 6-8 glasses of water daily, or as much as instructed by your health care  provider. Summary  Quitting smoking is a physical and mental challenge. You will face cravings, withdrawal symptoms, and temptation to smoke again. Preparation can help you as you go through these challenges.  You can cope with cravings by keeping your mouth busy (such as by chewing gum), keeping your body and hands busy, and making calls to family, friends,  or a helpline for people who want to quit smoking.  You can cope with withdrawal symptoms by avoiding places where people smoke, avoiding drinks with caffeine, and getting plenty of rest.  Ask your health care provider about the different ways to prevent weight gain, avoid stress, and handle social situations. This information is not intended to replace advice given to you by your health care provider. Make sure you discuss any questions you have with your health care provider. Document Revised: 12/27/2016 Document Reviewed: 01/12/2016 Elsevier Patient Education  2020 [employer].

## 2019-10-20 NOTE — Progress Notes (Signed)
Established Patient Office Visit  Subjective:  Patient ID: Sandra Stafford, female    DOB: Oct 23, 1960  Age: 59 y.o. MRN: [id number]  CC:  Chief Complaint  Patient presents with  . Follow-up    3 month follow up, no concerns     HPI Sandra Stafford presents for follow-up of anxiety and depression, vitamin D deficiency and tobacco use.  She is cut back on her smoking but is not ready to entirely quit at this time.  She has been taking her vitamin D on a weekly basis as directed.  Tells that the Cymbalta has been helping a good deal with anxiety and depression but she seems to have developed a headache that she thinks may be associated with its use.  Continues with Flonase and cetirizine for stuffy nose and drainage.  She has taken Flonase for years without a headache issue.  Has been unable to see the GYN doctor yet because she needs to find one who is in network for her.  Past Medical History:  Diagnosis Date  . Allergy   . Anxiety   . Asthma   . Depression   . GERD (gastroesophageal reflux disease)     Past Surgical History:  Procedure Laterality Date  . ABDOMINAL HYSTERECTOMY      Family History  Problem Relation Age of Onset  . Diabetes Mother   . Hypertension Mother   . Colon cancer Cousin   . Esophageal cancer Neg Hx   . Rectal cancer Neg Hx   . Stomach cancer Neg Hx     Social History   Socioeconomic History  . Marital status: Single    Spouse name: Not on file  . Number of children: Not on file  . Years of education: Not on file  . Highest education level: Not on file  Occupational History  . Not on file  Tobacco Use  . Smoking status: Current Every Day Smoker    Packs/day: 0.50    Years: 42.00    Pack years: 21.00    Types: Cigarettes  . Smokeless tobacco: Never Used  Vaping Use  . Vaping Use: Never used  Substance and Sexual Activity  . Alcohol use: Yes    Alcohol/week: 2.0 - 3.0 standard drinks    Types: 2 - 3 Standard drinks or equivalent per week      Comment: rarely  . Drug use: No  . Sexual activity: Not on file  Other Topics Concern  . Not on file  Social History Narrative  . Not on file   Social Determinants of Health   Financial Resource Strain:   . Difficulty of Paying Living Expenses: Not on file  Food Insecurity:   . Worried About Charity fundraiser in the Last Year: Not on file  . Ran Out of Food in the Last Year: Not on file  Transportation Needs:   . Lack of Transportation (Medical): Not on file  . Lack of Transportation (Non-Medical): Not on file  Physical Activity:   . Days of Exercise per Week: Not on file  . Minutes of Exercise per Session: Not on file  Stress:   . Feeling of Stress : Not on file  Social Connections:   . Frequency of Communication with Friends and Family: Not on file  . Frequency of Social Gatherings with Friends and Family: Not on file  . Attends Religious Services: Not on file  . Active Member of Clubs or Organizations: Not on file  .  Attends Archivist Meetings: Not on file  . Marital Status: Not on file  Intimate Partner Violence:   . Fear of Current or Ex-Partner: Not on file  . Emotionally Abused: Not on file  . Physically Abused: Not on file  . Sexually Abused: Not on file    Outpatient Medications Prior to Visit  Medication Sig Dispense Refill  . Cetirizine HCl 10 MG CAPS Take 1 capsule by mouth as needed.    . fluticasone (FLONASE) 50 MCG/ACT nasal spray Place 2 sprays into both nostrils daily. 16 g 6  . methocarbamol (ROBAXIN) 500 MG tablet Take 1 tablet (500 mg total) by mouth every 8 (eight) hours as needed for muscle spasms. 60 tablet 1  . Multiple Vitamin (MULTIVITAMIN ADULT PO) Take 1 tablet by mouth daily.    . pantoprazole (PROTONIX) 20 MG tablet Take 1 tablet (20 mg total) by mouth daily. 90 tablet 5  . Vitamin D, Ergocalciferol, (DRISDOL) 1.25 MG (50000 UNIT) CAPS capsule Take 1 capsule (50,000 Units total) by mouth every 7 (seven) days. 5 capsule 6  .  DULoxetine (CYMBALTA) 30 MG capsule Take 1 capsule (30 mg total) by mouth daily. 30 capsule 3   No facility-administered medications prior to visit.    Allergies  Allergen Reactions  . Dust Mite Mixed Allergen Ext [Mite (D. Farinae)]   . Naproxen Other (See Comments)    Abd pain   . Pollen Extract     ROS Review of Systems  Constitutional: Negative.   HENT: Positive for congestion and postnasal drip. Negative for nosebleeds.   Respiratory: Negative.   Cardiovascular: Negative.   Gastrointestinal: Negative.   Endocrine: Negative for polyphagia and polyuria.  Musculoskeletal: Positive for arthralgias.  Skin: Negative for pallor and rash.  Allergic/Immunologic: Negative for immunocompromised state.  Neurological: Positive for headaches. Negative for tremors, facial asymmetry and speech difficulty.  Hematological: Does not bruise/bleed easily.   Depression screen [facility] 2/9 07/05/2019 06/02/2019 05/17/2019  Decreased Interest 1 0 2  Down, Depressed, Hopeless 0 0 1  PHQ - 2 Score 1 0 3  Altered sleeping 1 - 2  Tired, decreased energy 1 - 2  Change in appetite 1 - 2  Feeling bad or failure about yourself  1 - 1  Trouble concentrating 1 - 1  Moving slowly or fidgety/restless 1 - 2  Suicidal thoughts 0 - 0  PHQ-9 Score 7 - 13  Difficult doing work/chores Not difficult at all - Somewhat difficult      Objective:    Physical Exam Vitals and nursing note reviewed.  Constitutional:      Appearance: Normal appearance.  HENT:     Head: Normocephalic and atraumatic.     Right Ear: Tympanic membrane, ear canal and external ear normal.     Left Ear: Tympanic membrane, ear canal and external ear normal.  Eyes:     General: No scleral icterus.       Right eye: No discharge.        Left eye: No discharge.     Extraocular Movements: Extraocular movements intact.     Conjunctiva/sclera: Conjunctivae normal.     Pupils: Pupils are equal, round, and reactive to light.  Cardiovascular:      Rate and Rhythm: Normal rate and regular rhythm.  Pulmonary:     Effort: Pulmonary effort is normal.     Breath sounds: Normal breath sounds.  Musculoskeletal:     Cervical back: No rigidity or tenderness.  Lymphadenopathy:  Cervical: No cervical adenopathy.  Skin:    General: Skin is warm and dry.  Neurological:     Mental Status: She is alert and oriented to person, place, and time.  Psychiatric:        Mood and Affect: Mood normal.        Behavior: Behavior normal.     BP 132/72   Pulse 87   Temp 97.6 F (36.4 C) (Tympanic)   Ht 5\' 4"  (1.626 m)   Wt 254 lb (115.2 kg)   SpO2 94%   BMI 43.60 kg/m  Wt Readings from Last 3 Encounters:  10/20/19 254 lb (115.2 kg)  10/01/19 258 lb (117 kg)  09/20/19 251 lb (113.9 kg)     Health Maintenance Due  Topic Date Due  . TETANUS/TDAP  Never done  . PAP SMEAR-Modifier  Never done  . MAMMOGRAM  01/31/2010    There are no preventive care reminders to display for this patient.  Lab Results  Component Value Date   TSH 1.55 05/17/2019   Lab Results  Component Value Date   WBC 8.3 05/17/2019   HGB 14.2 05/17/2019   HCT 42.1 05/17/2019   MCV 89.1 05/17/2019   PLT 151.0 05/17/2019   Lab Results  Component Value Date   NA 140 05/17/2019   K 4.1 05/17/2019   CO2 28 05/17/2019   GLUCOSE 93 05/17/2019   BUN 8 05/17/2019   CREATININE 0.89 05/17/2019   BILITOT 0.2 05/17/2019   ALKPHOS 51 05/17/2019   AST 11 05/17/2019   ALT 10 05/17/2019   PROT 5.5 (L) 05/17/2019   ALBUMIN 3.5 05/17/2019   CALCIUM 8.7 05/17/2019   GFR 78.47 05/17/2019   Lab Results  Component Value Date   CHOL 158 05/17/2019   Lab Results  Component Value Date   HDL 60.50 05/17/2019   Lab Results  Component Value Date   LDLCALC 68 05/17/2019   Lab Results  Component Value Date   TRIG 151.0 (H) 05/17/2019   Lab Results  Component Value Date   CHOLHDL 3 05/17/2019   No results found for: HGBA1C    Assessment & Plan:   Problem  List Items Addressed This Visit      Other   Tobacco use   Vitamin D deficiency   Relevant Orders   VITAMIN D 25 Hydroxy (Vit-D Deficiency, Fractures)   Depression with anxiety - Primary   Relevant Medications   FLUoxetine (PROZAC) 10 MG capsule   Medication side effect   Need for 23-polyvalent pneumococcal polysaccharide vaccine   Relevant Orders   Pneumococcal polysaccharide vaccine 23-valent greater than or equal to 2yo subcutaneous/IM (Completed)   Need for influenza vaccination   Relevant Orders   Flu Vaccine QUAD 6+ mos PF IM (Fluarix Quad PF) (Completed)      Meds ordered this encounter  Medications  . FLUoxetine (PROZAC) 10 MG capsule    Sig: Take one each morning for one week and then increase to 2 each morning.    Dispense:  60 capsule    Refill:  1    Follow-up: Return in about 1 month (around 11/19/2019).   We will discontinue Cymbalta secondary to probable side effect.  Start Prozac.  Patient given information steps to quit smoking and coping with quitting smoking.  Follow-up in 1 month. Libby Maw, MD

## 2019-11-16 ENCOUNTER — Other Ambulatory Visit: Payer: 59

## 2019-11-22 ENCOUNTER — Encounter: Payer: Self-pay | Admitting: Family Medicine

## 2019-11-22 ENCOUNTER — Other Ambulatory Visit: Payer: 59

## 2019-11-22 ENCOUNTER — Ambulatory Visit (INDEPENDENT_AMBULATORY_CARE_PROVIDER_SITE_OTHER): Payer: 59 | Admitting: Family Medicine

## 2019-11-22 ENCOUNTER — Other Ambulatory Visit: Payer: Self-pay

## 2019-11-22 ENCOUNTER — Ambulatory Visit (INDEPENDENT_AMBULATORY_CARE_PROVIDER_SITE_OTHER): Payer: 59

## 2019-11-22 VITALS — BP 138/76 | HR 86 | Temp 97.6°F | Ht 64.0 in | Wt 262.4 lb

## 2019-11-22 DIAGNOSIS — F418 Other specified anxiety disorders: Secondary | ICD-10-CM | POA: Diagnosis not present

## 2019-11-22 DIAGNOSIS — Z0000 Encounter for general adult medical examination without abnormal findings: Secondary | ICD-10-CM

## 2019-11-22 DIAGNOSIS — Z720 Tobacco use: Secondary | ICD-10-CM | POA: Diagnosis not present

## 2019-11-22 DIAGNOSIS — R0600 Dyspnea, unspecified: Secondary | ICD-10-CM

## 2019-11-22 DIAGNOSIS — N393 Stress incontinence (female) (male): Secondary | ICD-10-CM | POA: Insufficient documentation

## 2019-11-22 DIAGNOSIS — R0609 Other forms of dyspnea: Secondary | ICD-10-CM

## 2019-11-22 DIAGNOSIS — B9681 Helicobacter pylori [H. pylori] as the cause of diseases classified elsewhere: Secondary | ICD-10-CM

## 2019-11-22 HISTORY — DX: Stress incontinence (female) (male): N39.3

## 2019-11-22 MED ORDER — FLUOXETINE HCL 20 MG PO CAPS: 20.0000 mg | ORAL_CAPSULE | Freq: Every day | ORAL | 1 refills | Status: DC

## 2019-11-22 NOTE — Progress Notes (Signed)
Established Patient Office Visit  Subjective:  Patient ID: Sandra Stafford, female    DOB: [birth year]  Age: 59 y.o. MRN: [id number]  CC:  Chief Complaint  Patient presents with  . Follow-up    1 month follow up, patient has concerns about urine incontinence when she is out of breath.     HPI Sandra Stafford presents for follow-up of her depression, vitamin D deficiency and health maintenance.  Tells of dyspnea on exertion walking from her car into her place of work.  She has a history of allergy associated asthma.  She has been smoking for 40 years.  Unintentional loss of urine when she is short of breath.  There is no chest pain nausea or diaphoresis.  Feels better with the Prozac.  Has difficulty going to sleep with Prozac.  She works third shift and takes it before going to bed.  She has been taking her high-dose vitamin D supplement weekly.  Past Medical History:  Diagnosis Date  . Allergy   . Anxiety   . Asthma   . Depression   . GERD (gastroesophageal reflux disease)     Past Surgical History:  Procedure Laterality Date  . ABDOMINAL HYSTERECTOMY      Family History  Problem Relation Age of Onset  . Diabetes Mother   . Hypertension Mother   . Colon cancer Cousin   . Esophageal cancer Neg Hx   . Rectal cancer Neg Hx   . Stomach cancer Neg Hx     Social History   Socioeconomic History  . Marital status: Single    Spouse name: Not on file  . Number of children: Not on file  . Years of education: Not on file  . Highest education level: Not on file  Occupational History  . Not on file  Tobacco Use  . Smoking status: Current Every Day Smoker    Packs/day: 0.50    Years: 42.00    Pack years: 21.00    Types: Cigarettes  . Smokeless tobacco: Never Used  Vaping Use  . Vaping Use: Never used  Substance and Sexual Activity  . Alcohol use: Yes    Alcohol/week: 2.0 - 3.0 standard drinks    Types: 2 - 3 Standard drinks or equivalent per week    Comment: rarely  .  Drug use: No  . Sexual activity: Not on file  Other Topics Concern  . Not on file  Social History Narrative  . Not on file   Social Determinants of Health   Financial Resource Strain:   . Difficulty of Paying Living Expenses: Not on file  Food Insecurity:   . Worried About Charity fundraiser in the Last Year: Not on file  . Ran Out of Food in the Last Year: Not on file  Transportation Needs:   . Lack of Transportation (Medical): Not on file  . Lack of Transportation (Non-Medical): Not on file  Physical Activity:   . Days of Exercise per Week: Not on file  . Minutes of Exercise per Session: Not on file  Stress:   . Feeling of Stress : Not on file  Social Connections:   . Frequency of Communication with Friends and Family: Not on file  . Frequency of Social Gatherings with Friends and Family: Not on file  . Attends Religious Services: Not on file  . Active Member of Clubs or Organizations: Not on file  . Attends Archivist Meetings: Not on file  .  Marital Status: Not on file  Intimate Partner Violence:   . Fear of Current or Ex-Partner: Not on file  . Emotionally Abused: Not on file  . Physically Abused: Not on file  . Sexually Abused: Not on file    Outpatient Medications Prior to Visit  Medication Sig Dispense Refill  . Cetirizine HCl 10 MG CAPS Take 1 capsule by mouth as needed.    . fluticasone (FLONASE) 50 MCG/ACT nasal spray Place 2 sprays into both nostrils daily. 16 g 6  . methocarbamol (ROBAXIN) 500 MG tablet Take 1 tablet (500 mg total) by mouth every 8 (eight) hours as needed for muscle spasms. 60 tablet 1  . Multiple Vitamin (MULTIVITAMIN ADULT PO) Take 1 tablet by mouth daily.    . pantoprazole (PROTONIX) 20 MG tablet Take 1 tablet (20 mg total) by mouth daily. 90 tablet 5  . Vitamin D, Ergocalciferol, (DRISDOL) 1.25 MG (50000 UNIT) CAPS capsule Take 1 capsule (50,000 Units total) by mouth every 7 (seven) days. 5 capsule 6  . FLUoxetine (PROZAC) 10 MG  capsule Take one each morning for one week and then increase to 2 each morning. 60 capsule 1   No facility-administered medications prior to visit.    Allergies  Allergen Reactions  . Dust Mite Mixed Allergen Ext [Mite (D. Farinae)]   . Naproxen Other (See Comments)    Abd pain   . Pollen Extract     ROS Review of Systems  Constitutional: Negative.  Negative for diaphoresis.  HENT: Negative.   Eyes: Negative for photophobia and visual disturbance.  Respiratory: Positive for shortness of breath. Negative for chest tightness and wheezing.   Cardiovascular: Negative.  Negative for chest pain and palpitations.  Gastrointestinal: Negative.  Negative for nausea and vomiting.  Endocrine: Negative for polyphagia and polyuria.  Genitourinary: Negative.   Musculoskeletal: Negative for gait problem and joint swelling.  Allergic/Immunologic: Negative for immunocompromised state.  Neurological: Negative for speech difficulty and weakness.  Hematological: Does not bruise/bleed easily.      Objective:    Physical Exam Vitals and nursing note reviewed.  Constitutional:      General: She is not in acute distress.    Appearance: Normal appearance. She is obese. She is not ill-appearing, toxic-appearing or diaphoretic.  HENT:     Head: Normocephalic and atraumatic.     Right Ear: External ear normal.     Left Ear: External ear normal.  Eyes:     General:        Right eye: No discharge.        Left eye: No discharge.     Extraocular Movements: Extraocular movements intact.     Conjunctiva/sclera: Conjunctivae normal.     Pupils: Pupils are equal, round, and reactive to light.  Cardiovascular:     Rate and Rhythm: Normal rate and regular rhythm.  Pulmonary:     Effort: Pulmonary effort is normal. No respiratory distress.     Breath sounds: No stridor. No rhonchi or rales.  Abdominal:     General: Bowel sounds are normal.  Musculoskeletal:     Cervical back: No rigidity or  tenderness.  Lymphadenopathy:     Cervical: No cervical adenopathy.  Skin:    General: Skin is warm and dry.  Neurological:     Mental Status: She is alert and oriented to person, place, and time.  Psychiatric:        Mood and Affect: Mood normal.        Behavior:  Behavior normal.     BP 138/76   Pulse 86   Temp 97.6 F (36.4 C) (Tympanic)   Ht 5\' 4"  (1.626 m)   Wt 262 lb 6.4 oz (119 kg)   SpO2 94%   BMI 45.04 kg/m  Wt Readings from Last 3 Encounters:  11/22/19 262 lb 6.4 oz (119 kg)  10/20/19 254 lb (115.2 kg)  10/01/19 258 lb (117 kg)     Health Maintenance Due  Topic Date Due  . TETANUS/TDAP  Never done  . PAP SMEAR-Modifier  Never done  . MAMMOGRAM  01/31/2010    There are no preventive care reminders to display for this patient.  Lab Results  Component Value Date   TSH 1.55 05/17/2019   Lab Results  Component Value Date   WBC 8.3 05/17/2019   HGB 14.2 05/17/2019   HCT 42.1 05/17/2019   MCV 89.1 05/17/2019   PLT 151.0 05/17/2019   Lab Results  Component Value Date   NA 140 05/17/2019   K 4.1 05/17/2019   CO2 28 05/17/2019   GLUCOSE 93 05/17/2019   BUN 8 05/17/2019   CREATININE 0.89 05/17/2019   BILITOT 0.2 05/17/2019   ALKPHOS 51 05/17/2019   AST 11 05/17/2019   ALT 10 05/17/2019   PROT 5.5 (L) 05/17/2019   ALBUMIN 3.5 05/17/2019   CALCIUM 8.7 05/17/2019   GFR 78.47 05/17/2019   Lab Results  Component Value Date   CHOL 158 05/17/2019   Lab Results  Component Value Date   HDL 60.50 05/17/2019   Lab Results  Component Value Date   LDLCALC 68 05/17/2019   Lab Results  Component Value Date   TRIG 151.0 (H) 05/17/2019   Lab Results  Component Value Date   CHOLHDL 3 05/17/2019   No results found for: HGBA1C    Assessment & Plan:   Problem List Items Addressed This Visit      Other   Tobacco use   Healthcare maintenance   Relevant Orders   MM Digital Screening   Depression with anxiety - Primary   Relevant Medications    FLUoxetine (PROZAC) 20 MG capsule   DOE (dyspnea on exertion)   Relevant Orders   DG Chest 2 View   Stress incontinence      Meds ordered this encounter  Medications  . FLUoxetine (PROZAC) 20 MG capsule    Sig: Take 1 capsule (20 mg total) by mouth daily.    Dispense:  90 capsule    Refill:  1    Follow-up: Return in about 4 months (around 03/24/2020).  Given information on coping with quitting smoking and encouraged to do so.  She was also given Kegel exercises.  Continue Prozac.  She will take it before she goes into work each night.  Checking chest x-ray today.  Believe that there may be COPD.  Will consider pulmonary referral.  Continue Prozac at 20 mg.  Follow-up 4 months  Libby Maw, MD

## 2019-11-22 NOTE — Patient Instructions (Signed)
Coping with Quitting Smoking  Quitting smoking is a physical and mental challenge. You will face cravings, withdrawal symptoms, and temptation. Before quitting, work with your health care provider to make a plan that can help you cope. Preparation can help you quit and keep you from giving in. How can I cope with cravings? Cravings usually last for 5-10 minutes. If you get through it, the craving will pass. Consider taking the following actions to help you cope with cravings:  Keep your mouth busy: ? Chew sugar-free gum. ? Suck on hard candies or a straw. ? Brush your teeth.  Keep your hands and body busy: ? Immediately change to a different activity when you feel a craving. ? Squeeze or play with a ball. ? Do an activity or a hobby, like making bead jewelry, practicing needlepoint, or working with wood. ? Mix up your normal routine. ? Take a short exercise break. Go for a quick walk or run up and down stairs. ? Spend time in public places where smoking is not allowed.  Focus on doing something kind or helpful for someone else.  Call a friend or family member to talk during a craving.  Join a support group.  Call a quit line, such as 1-800-QUIT-NOW.  Talk with your health care provider about medicines that might help you cope with cravings and make quitting easier for you. How can I deal with withdrawal symptoms? Your body may experience negative effects as it tries to get used to not having nicotine in the system. These effects are called withdrawal symptoms. They may include:  Feeling hungrier than normal.  Trouble concentrating.  Irritability.  Trouble sleeping.  Feeling depressed.  Restlessness and agitation.  Craving a cigarette. To manage withdrawal symptoms:  Avoid places, people, and activities that trigger your cravings.  Remember why you want to quit.  Get plenty of sleep.  Avoid coffee and other caffeinated drinks. These may worsen some of your  symptoms. How can I handle social situations? Social situations can be difficult when you are quitting smoking, especially in the first few weeks. To manage this, you can:  Avoid parties, bars, and other social situations where people might be smoking.  Avoid alcohol.  Leave right away if you have the urge to smoke.  Explain to your family and friends that you are quitting smoking. Ask for understanding and support.  Plan activities with friends or family where smoking is not an option. What are some ways I can cope with stress? Wanting to smoke may cause stress, and stress can make you want to smoke. Find ways to manage your stress. Relaxation techniques can help. For example:  Breathe slowly and deeply, in through your nose and out through your mouth.  Listen to soothing, relaxing music.  Talk with a family member or friend about your stress.  Light a candle.  Soak in a bath or take a shower.  Think about a peaceful place. What are some ways I can prevent weight gain? Be aware that many people gain weight after they quit smoking. However, not everyone does. To keep from gaining weight, have a plan in place before you quit and stick to the plan after you quit. Your plan should include:  Having healthy snacks. When you have a craving, it may help to: ? Eat plain popcorn, crunchy carrots, celery, or other cut vegetables. ? Chew sugar-free gum.  Changing how you eat: ? Eat small portion sizes at meals. ? Eat 4-6 small meals   throughout the day instead of 1-2 large meals a day. ? Be mindful when you eat. Do not watch television or do other things that might distract you as you eat.  Exercising regularly: ? Make time to exercise each day. If you do not have time for a long workout, do short bouts of exercise for 5-10 minutes several times a day. ? Do some form of strengthening exercise, like weight lifting, and some form of aerobic exercise, like running or swimming.  Drinking  plenty of water or other low-calorie or no-calorie drinks. Drink 6-8 glasses of water daily, or as much as instructed by your health care provider. Summary  Quitting smoking is a physical and mental challenge. You will face cravings, withdrawal symptoms, and temptation to smoke again. Preparation can help you as you go through these challenges.  You can cope with cravings by keeping your mouth busy (such as by chewing gum), keeping your body and hands busy, and making calls to family, friends, or a helpline for people who want to quit smoking.  You can cope with withdrawal symptoms by avoiding places where people smoke, avoiding drinks with caffeine, and getting plenty of rest.  Ask your health care provider about the different ways to prevent weight gain, avoid stress, and handle social situations. This information is not intended to replace advice given to you by your health care provider. Make sure you discuss any questions you have with your health care provider. Document Revised: 12/27/2016 Document Reviewed: 01/12/2016 Elsevier Patient Education  [place].  Kegel Exercises  Kegel exercises can help strengthen your pelvic floor muscles. The pelvic floor is a group of muscles that support your rectum, small intestine, and bladder. In females, pelvic floor muscles also help support the womb (uterus). These muscles help you control the flow of urine and stool. Kegel exercises are painless and simple, and they do not require any equipment. Your provider may suggest Kegel exercises to:  Improve bladder and bowel control.  Improve sexual response.  Improve weak pelvic floor muscles after surgery to remove the uterus (hysterectomy) or pregnancy (females).  Improve weak pelvic floor muscles after prostate gland removal or surgery (males). Kegel exercises involve squeezing your pelvic floor muscles, which are the same muscles you squeeze when you try to stop the flow of urine or keep  from passing gas. The exercises can be done while sitting, standing, or lying down, but it is best to vary your position. Exercises How to do Kegel exercises: 1. Squeeze your pelvic floor muscles tight. You should feel a tight lift in your rectal area. If you are a female, you should also feel a tightness in your vaginal area. Keep your stomach, buttocks, and legs relaxed. 2. Hold the muscles tight for up to 10 seconds. 3. Breathe normally. 4. Relax your muscles. 5. Repeat as told by your health care provider. Repeat this exercise daily as told by your health care provider. Continue to do this exercise for at least 4-6 weeks, or for as long as told by your health care provider. You may be referred to a physical therapist who can help you learn more about how to do Kegel exercises. Depending on your condition, your health care provider may recommend:  Varying how long you squeeze your muscles.  Doing several sets of exercises every day.  Doing exercises for several weeks.  Making Kegel exercises a part of your regular exercise routine. This information is not intended to replace advice given to you  by your health care provider. Make sure you discuss any questions you have with your health care provider. Document Revised: 09/03/2017 Document Reviewed: 09/03/2017 Elsevier Patient Education  [place].

## 2019-11-23 LAB — HELICOBACTER PYLORI  SPECIAL ANTIGEN
MICRO NUMBER:: 11113746
SPECIMEN QUALITY: ADEQUATE

## 2019-11-26 ENCOUNTER — Telehealth: Payer: Self-pay | Admitting: General Surgery

## 2019-11-26 NOTE — Telephone Encounter (Signed)
-----   Message from Tillson, DO sent at 11/24/2019  5:10 PM EDT ----- H pylori negative c/w successful eradication. No further w/u needed at this time.

## 2019-11-26 NOTE — Telephone Encounter (Signed)
Notified the patient her h. Pylori test was negative. Patient verbalized understanding.

## 2019-11-30 ENCOUNTER — Ambulatory Visit: Payer: 59 | Admitting: Family Medicine

## 2020-03-27 ENCOUNTER — Ambulatory Visit: Payer: 59 | Admitting: Family Medicine

## 2020-03-28 ENCOUNTER — Other Ambulatory Visit: Payer: Self-pay

## 2020-03-28 DIAGNOSIS — K219 Gastro-esophageal reflux disease without esophagitis: Secondary | ICD-10-CM | POA: Insufficient documentation

## 2020-03-28 DIAGNOSIS — T7840XA Allergy, unspecified, initial encounter: Secondary | ICD-10-CM | POA: Insufficient documentation

## 2020-03-28 DIAGNOSIS — F419 Anxiety disorder, unspecified: Secondary | ICD-10-CM | POA: Insufficient documentation

## 2020-03-28 DIAGNOSIS — F32A Depression, unspecified: Secondary | ICD-10-CM | POA: Insufficient documentation

## 2020-03-28 DIAGNOSIS — J45909 Unspecified asthma, uncomplicated: Secondary | ICD-10-CM | POA: Insufficient documentation

## 2020-04-05 ENCOUNTER — Ambulatory Visit: Payer: 59 | Admitting: Cardiology

## 2020-06-16 ENCOUNTER — Other Ambulatory Visit: Payer: Self-pay

## 2020-06-16 ENCOUNTER — Ambulatory Visit (INDEPENDENT_AMBULATORY_CARE_PROVIDER_SITE_OTHER): Payer: 59 | Admitting: Family Medicine

## 2020-06-16 ENCOUNTER — Encounter: Payer: Self-pay | Admitting: Family Medicine

## 2020-06-16 VITALS — BP 128/80 | HR 82 | Temp 97.2°F | Ht 64.0 in | Wt 274.0 lb

## 2020-06-16 DIAGNOSIS — Z0000 Encounter for general adult medical examination without abnormal findings: Secondary | ICD-10-CM

## 2020-06-16 DIAGNOSIS — F418 Other specified anxiety disorders: Secondary | ICD-10-CM | POA: Diagnosis not present

## 2020-06-16 DIAGNOSIS — D696 Thrombocytopenia, unspecified: Secondary | ICD-10-CM

## 2020-06-16 DIAGNOSIS — E559 Vitamin D deficiency, unspecified: Secondary | ICD-10-CM | POA: Diagnosis not present

## 2020-06-16 DIAGNOSIS — Z720 Tobacco use: Secondary | ICD-10-CM

## 2020-06-16 DIAGNOSIS — M353 Polymyalgia rheumatica: Secondary | ICD-10-CM

## 2020-06-16 DIAGNOSIS — Z6841 Body Mass Index (BMI) 40.0 and over, adult: Secondary | ICD-10-CM

## 2020-06-16 DIAGNOSIS — M2550 Pain in unspecified joint: Secondary | ICD-10-CM

## 2020-06-16 MED ORDER — DULOXETINE HCL 30 MG PO CPEP: 30.0000 mg | ORAL_CAPSULE | Freq: Every day | ORAL | 1 refills | Status: DC

## 2020-06-16 NOTE — Patient Instructions (Signed)
Health Maintenance, Female Adopting a healthy lifestyle and getting preventive care are important in promoting health and wellness. Ask your health care provider about:  The right schedule for you to have regular tests and exams.  Things you can do on your own to prevent diseases and keep yourself healthy. What should I know about diet, weight, and exercise? Eat a healthy diet  Eat a diet that includes plenty of vegetables, fruits, low-fat dairy products, and lean protein.  Do not eat a lot of foods that are high in solid fats, added sugars, or sodium.   Maintain a healthy weight Body mass index (BMI) is used to identify weight problems. It estimates body fat based on height and weight. Your health care provider can help determine your BMI and help you achieve or maintain a healthy weight. Get regular exercise Get regular exercise. This is one of the most important things you can do for your health. Most adults should:  Exercise for at least 150 minutes each week. The exercise should increase your heart rate and make you sweat (moderate-intensity exercise).  Do strengthening exercises at least twice a week. This is in addition to the moderate-intensity exercise.  Spend less time sitting. Even light physical activity can be beneficial. Watch cholesterol and blood lipids Have your blood tested for lipids and cholesterol at 60 years of age, then have this test every 5 years. Have your cholesterol levels checked more often if:  Your lipid or cholesterol levels are high.  You are older than 60 years of age.  You are at high risk for heart disease. What should I know about cancer screening? Depending on your health history and family history, you may need to have cancer screening at various ages. This may include screening for:  Breast cancer.  Cervical cancer.  Colorectal cancer.  Skin cancer.  Lung cancer. What should I know about heart disease, diabetes, and high blood  pressure? Blood pressure and heart disease  High blood pressure causes heart disease and increases the risk of stroke. This is more likely to develop in people who have high blood pressure readings, are of African descent, or are overweight.  Have your blood pressure checked: ? Every 3-5 years if you are 59-59 years of age. ? Every year if you are 63 years old or older. Diabetes Have regular diabetes screenings. This checks your fasting blood sugar level. Have the screening done:  Once every three years after age 58 if you are at a normal weight and have a low risk for diabetes.  More often and at a younger age if you are overweight or have a high risk for diabetes. What should I know about preventing infection? Hepatitis B If you have a higher risk for hepatitis B, you should be screened for this virus. Talk with your health care provider to find out if you are at risk for hepatitis B infection. Hepatitis C Testing is recommended for:  Everyone born from [birth year] through 1965.  Anyone with known risk factors for hepatitis C. Sexually transmitted infections (STIs)  Get screened for STIs, including gonorrhea and chlamydia, if: ? You are sexually active and are younger than 60 years of age. ? You are older than 60 years of age and your health care provider tells you that you are at risk for this type of infection. ? Your sexual activity has changed since you were last screened, and you are at increased risk for chlamydia or gonorrhea. Ask your health care provider  if you are at risk.  Ask your health care provider about whether you are at high risk for HIV. Your health care provider may recommend a prescription medicine to help prevent HIV infection. If you choose to take medicine to prevent HIV, you should first get tested for HIV. You should then be tested every 3 months for as long as you are taking the medicine. Pregnancy  If you are about to stop having your period (premenopausal) and  you may become pregnant, seek counseling before you get pregnant.  Take 400 to 800 micrograms (mcg) of folic acid every day if you become pregnant.  Ask for birth control (contraception) if you want to prevent pregnancy. Osteoporosis and menopause Osteoporosis is a disease in which the bones lose minerals and strength with aging. This can result in bone fractures. If you are 44 years old or older, or if you are at risk for osteoporosis and fractures, ask your health care provider if you should:  Be screened for bone loss.  Take a calcium or vitamin D supplement to lower your risk of fractures.  Be given hormone replacement therapy (HRT) to treat symptoms of menopause. Follow these instructions at home: Lifestyle  Do not use any products that contain nicotine or tobacco, such as cigarettes, e-cigarettes, and chewing tobacco. If you need help quitting, ask your health care provider.  Do not use street drugs.  Do not share needles.  Ask your health care provider for help if you need support or information about quitting drugs. Alcohol use  Do not drink alcohol if: ? Your health care provider tells you not to drink. ? You are pregnant, may be pregnant, or are planning to become pregnant.  If you drink alcohol: ? Limit how much you use to 0-1 drink a day. ? Limit intake if you are breastfeeding.  Be aware of how much alcohol is in your drink. In the [place] one drink equals one 12 oz bottle of beer (355 mL), one 5 oz glass of wine (148 mL), or one 1 oz glass of hard liquor (44 mL). General instructions  Schedule regular health, dental, and eye exams.  Stay current with your vaccines.  Tell your health care provider if: ? You often feel depressed. ? You have ever been abused or do not feel safe at home. Summary  Adopting a healthy lifestyle and getting preventive care are important in promoting health and wellness.  Follow your health care provider's instructions about healthy  diet, exercising, and getting tested or screened for diseases.  Follow your health care provider's instructions on monitoring your cholesterol and blood pressure. This information is not intended to replace advice given to you by your health care provider. Make sure you discuss any questions you have with your health care provider. Document Revised: 01/07/2018 Document Reviewed: 01/07/2018 Elsevier Patient Education  2021 [employer]  Preventive Care 23-54 Years Old, Female Preventive care refers to lifestyle choices and visits with your health care provider that can promote health and wellness. This includes:  A yearly physical exam. This is also called an annual wellness visit.  Regular dental and eye exams.  Immunizations.  Screening for certain conditions.  Healthy lifestyle choices, such as: ? Eating a healthy diet. ? Getting regular exercise. ? Not using drugs or products that contain nicotine and tobacco. ? Limiting alcohol use. What can I expect for my preventive care visit? Physical exam Your health care provider will check your:  Height and weight. These may  be used to calculate your BMI (body mass index). BMI is a measurement that tells if you are at a healthy weight.  Heart rate and blood pressure.  Body temperature.  Skin for abnormal spots. Counseling Your health care provider may ask you questions about your:  Past medical problems.  Family's medical history.  Alcohol, tobacco, and drug use.  Emotional well-being.  Home life and relationship well-being.  Sexual activity.  Diet, exercise, and sleep habits.  Work and work Statistician.  Access to firearms.  Method of birth control.  Menstrual cycle.  Pregnancy history. What immunizations do I need? Vaccines are usually given at various ages, according to a schedule. Your health care provider will recommend vaccines for you based on your age, medical history, and lifestyle or other factors,  such as travel or where you work.   What tests do I need? Blood tests  Lipid and cholesterol levels. These may be checked every 5 years, or more often if you are over 65 years old.  Hepatitis C test.  Hepatitis B test. Screening  Lung cancer screening. You may have this screening every year starting at age 90 if you have a 30-pack-year history of smoking and currently smoke or have quit within the past 15 years.  Colorectal cancer screening. ? All adults should have this screening starting at age 16 and continuing until age 38. ? Your health care provider may recommend screening at age 30 if you are at increased risk. ? You will have tests every 1-10 years, depending on your results and the type of screening test.  Diabetes screening. ? This is done by checking your blood sugar (glucose) after you have not eaten for a while (fasting). ? You may have this done every 1-3 years.  Mammogram. ? This may be done every 1-2 years. ? Talk with your health care provider about when you should start having regular mammograms. This may depend on whether you have a family history of breast cancer.  BRCA-related cancer screening. This may be done if you have a family history of breast, ovarian, tubal, or peritoneal cancers.  Pelvic exam and Pap test. ? This may be done every 3 years starting at age 58. ? Starting at age 60, this may be done every 5 years if you have a Pap test in combination with an HPV test. Other tests  STD (sexually transmitted disease) testing, if you are at risk.  Bone density scan. This is done to screen for osteoporosis. You may have this scan if you are at high risk for osteoporosis. Talk with your health care provider about your test results, treatment options, and if necessary, the need for more tests. Follow these instructions at home: Eating and drinking  Eat a diet that includes fresh fruits and vegetables, whole grains, lean protein, and low-fat dairy  products.  Take vitamin and mineral supplements as recommended by your health care provider.  Do not drink alcohol if: ? Your health care provider tells you not to drink. ? You are pregnant, may be pregnant, or are planning to become pregnant.  If you drink alcohol: ? Limit how much you have to 0-1 drink a day. ? Be aware of how much alcohol is in your drink. In the [place] one drink equals one 12 oz bottle of beer (355 mL), one 5 oz glass of wine (148 mL), or one 1 oz glass of hard liquor (44 mL).   Lifestyle  Take daily care of your teeth and  gums. Brush your teeth every morning and night with fluoride toothpaste. Floss one time each day.  Stay active. Exercise for at least 30 minutes 5 or more days each week.  Do not use any products that contain nicotine or tobacco, such as cigarettes, e-cigarettes, and chewing tobacco. If you need help quitting, ask your health care provider.  Do not use drugs.  If you are sexually active, practice safe sex. Use a condom or other form of protection to prevent STIs (sexually transmitted infections).  If you do not wish to become pregnant, use a form of birth control. If you plan to become pregnant, see your health care provider for a prepregnancy visit.  If told by your health care provider, take low-dose aspirin daily starting at age 87.  Find healthy ways to cope with stress, such as: ? Meditation, yoga, or listening to music. ? Journaling. ? Talking to a trusted person. ? Spending time with friends and family. Safety  Always wear your seat belt while driving or riding in a vehicle.  Do not drive: ? If you have been drinking alcohol. Do not ride with someone who has been drinking. ? When you are tired or distracted. ? While texting.  Wear a helmet and other protective equipment during sports activities.  If you have firearms in your house, make sure you follow all gun safety procedures. What's next?  Visit your health care provider  once a year for an annual wellness visit.  Ask your health care provider how often you should have your eyes and teeth checked.  Stay up to date on all vaccines. This information is not intended to replace advice given to you by your health care provider. Make sure you discuss any questions you have with your health care provider. Document Revised: 10/19/2019 Document Reviewed: 09/25/2017 Elsevier Patient Education  2021 [place].  Steps to Quit Smoking Smoking tobacco is the leading cause of preventable death. It can affect almost every organ in the body. Smoking puts you and people around you at risk for many serious, long-lasting (chronic) diseases. Quitting smoking can be hard, but it is one of the best things that you can do for your health. It is never too late to quit. How do I get ready to quit? When you decide to quit smoking, make a plan to help you succeed. Before you quit:  Pick a date to quit. Set a date within the next 2 weeks to give you time to prepare.  Write down the reasons why you are quitting. Keep this list in places where you will see it often.  Tell your family, friends, and co-workers that you are quitting. Their support is important.  Talk with your doctor about the choices that may help you quit.  Find out if your health insurance will pay for these treatments.  Know the people, places, things, and activities that make you want to smoke (triggers). Avoid them. What first steps can I take to quit smoking?  Throw away all cigarettes at home, at work, and in your car.  Throw away the things that you use when you smoke, such as ashtrays and lighters.  Clean your car. Make sure to empty the ashtray.  Clean your home, including curtains and carpets. What can I do to help me quit smoking? Talk with your doctor about taking medicines and seeing a counselor at the same time. You are more likely to succeed when you do both.  If you are pregnant or  breastfeeding, talk with your doctor about counseling or other ways to quit smoking. Do not take medicine to help you quit smoking unless your doctor tells you to do so. To quit smoking: Quit right away  Quit smoking totally, instead of slowly cutting back on how much you smoke over a period of time.  Go to counseling. You are more likely to quit if you go to counseling sessions regularly. Take medicine You may take medicines to help you quit. Some medicines need a prescription, and some you can buy over-the-counter. Some medicines may contain a drug called nicotine to replace the nicotine in cigarettes. Medicines may:  Help you to stop having the desire to smoke (cravings).  Help to stop the problems that come when you stop smoking (withdrawal symptoms). Your doctor may ask you to use:  Nicotine patches, gum, or lozenges.  Nicotine inhalers or sprays.  Non-nicotine medicine that is taken by mouth. Find resources Find resources and other ways to help you quit smoking and remain smoke-free after you quit. These resources are most helpful when you use them often. They include:  Online chats with a Social worker.  Phone quitlines.  Printed Furniture conservator/restorer.  Support groups or group counseling.  Text messaging programs.  Mobile phone apps. Use apps on your mobile phone or tablet that can help you stick to your quit plan. There are many free apps for mobile phones and tablets as well as websites. Examples include Quit Guide from the [employer] and smokefree.gov   What things can I do to make it easier to quit?  Talk to your family and friends. Ask them to support and encourage you.  Call a phone quitline (1-800-QUIT-NOW), reach out to support groups, or work with a Social worker.  Ask people who smoke to not smoke around you.  Avoid places that make you want to smoke, such as: ? Bars. ? Parties. ? Smoke-break areas at work.  Spend time with people who do not smoke.  Lower the stress  in your life. Stress can make you want to smoke. Try these things to help your stress: ? Getting regular exercise. ? Doing deep-breathing exercises. ? Doing yoga. ? Meditating. ? Doing a body scan. To do this, close your eyes, focus on one area of your body at a time from head to toe. Notice which parts of your body are tense. Try to relax the muscles in those areas.   How will I feel when I quit smoking? Day 1 to 3 weeks Within the first 24 hours, you may start to have some problems that come from quitting tobacco. These problems are very bad 2-3 days after you quit, but they do not often last for more than 2-3 weeks. You may get these symptoms:  Mood swings.  Feeling restless, nervous, angry, or annoyed.  Trouble concentrating.  Dizziness.  Strong desire for high-sugar foods and nicotine.  Weight gain.  Trouble pooping (constipation).  Feeling like you may vomit (nausea).  Coughing or a sore throat.  Changes in how the medicines that you take for other issues work in your body.  Depression.  Trouble sleeping (insomnia). Week 3 and afterward After the first 2-3 weeks of quitting, you may start to notice more positive results, such as:  Better sense of smell and taste.  Less coughing and sore throat.  Slower heart rate.  Lower blood pressure.  Clearer skin.  Better breathing.  Fewer sick days. Quitting smoking can be hard. Do not give up if  you fail the first time. Some people need to try a few times before they succeed. Do your best to stick to your quit plan, and talk with your doctor if you have any questions or concerns. Summary  Smoking tobacco is the leading cause of preventable death. Quitting smoking can be hard, but it is one of the best things that you can do for your health.  When you decide to quit smoking, make a plan to help you succeed.  Quit smoking right away, not slowly over a period of time.  When you start quitting, seek help from your  doctor, family, or friends. This information is not intended to replace advice given to you by your health care provider. Make sure you discuss any questions you have with your health care provider. Document Revised: 10/09/2018 Document Reviewed: 04/04/2018 Elsevier Patient Education  [place].

## 2020-06-16 NOTE — Progress Notes (Addendum)
Established Patient Office Visit  Subjective:  Patient ID: Sandra Stafford, female    DOB: 09/21/1960  Age: 60 y.o. MRN: [id number]  CC:  Chief Complaint  Patient presents with  . Neck Pain    Pt c/o right sided neck pain radiating down arm x several months. Also having pain both hands and swelling. Pt took Tylenol arthritis.  . Depression  . Anxiety    HPI Sandra Stafford presents for follow-up of multiple issues and problems.  She hurts.  Complains of joint aches and pains in her hands wrists elbows and shoulders.  There is pain in her knees as well.  There is neck pain.  She is depressed.  She tells me that she was advised to discontinue her duloxetine by an urgent care provider.  Review of their notes did not show where she was asked to stop the duloxetine.  She has not been able to go for her Pap smear or mammogram.  Her finances are stressed but things are looking better.  Sometimes difficult for her to work because of pain.  She has gained weight since this past fall.  Continues to smoke.  Past Medical History:  Diagnosis Date  . ALLERGIC RHINITIS 11/03/2006   Qualifier: Diagnosis of  By: Enrique Sack    . Allergy   . Anxiety   . Aortic stenosis mild, based on echo from 2021 10/01/2019  . Asthma   . CARCINOMA, VULVA 11/03/2006   Qualifier: Diagnosis of  By: Enrique Sack    . Class 3 severe obesity due to excess calories with body mass index (BMI) of 40.0 to 44.9 in adult [facility]) 11/03/2006   Qualifier: Diagnosis of  By: Enrique Sack    . Depression   . Depression with anxiety 06/02/2019  . DOE (dyspnea on exertion) 08/04/2019  . DYSPEPSIA 11/03/2006   Qualifier: History of  By: Enrique Sack    . GERD (gastroesophageal reflux disease)   . Healthcare maintenance 05/17/2019  . Heart murmur 07/05/2019  . Medication side effect 10/20/2019  . Muscle cramps 06/02/2019  . Need for 23-polyvalent pneumococcal polysaccharide vaccine 10/20/2019  . Need for influenza vaccination 10/20/2019   . Polyarthralgia 05/17/2019  . 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[place]) 05/17/2019  . PUD 11/03/2006   Qualifier: Diagnosis of  By: Enrique Sack    . Seasonal allergic rhinitis due to pollen 06/02/2019  . Stress incontinence 11/22/2019  . Tobacco use 11/03/2006   Qualifier: Diagnosis of  By: Enrique Sack    . VENEREAL WART 11/03/2006   Qualifier: Diagnosis of  By: Enrique Sack    . Vitamin D deficiency 05/22/2019    Past Surgical History:  Procedure Laterality Date  . ABDOMINAL HYSTERECTOMY      Family History  Problem Relation Age of Onset  . Diabetes Mother   . Hypertension Mother   . Colon cancer Cousin   . Esophageal cancer Neg Hx   . Rectal cancer Neg Hx   . Stomach cancer Neg Hx     Social History   Socioeconomic History  . Marital status: Single    Spouse name: Not on file  . Number of children: Not on file  . Years of education: Not on file  . Highest education level: Not on file  Occupational History  . Not on file  Tobacco Use  . Smoking status: Current Every Day Smoker    Packs/day: 0.50    Years: 42.00    Pack years: 21.00    Types: Cigarettes  .  Smokeless tobacco: Never Used  Vaping Use  . Vaping Use: Never used  Substance and Sexual Activity  . Alcohol use: Yes    Alcohol/week: 2.0 - 3.0 standard drinks    Types: 2 - 3 Standard drinks or equivalent per week    Comment: rarely  . Drug use: No  . Sexual activity: Not on file  Other Topics Concern  . Not on file  Social History Narrative  . Not on file   Social Determinants of Health   Financial Resource Strain: Not on file  Food Insecurity: Not on file  Transportation Needs: Not on file  Physical Activity: Not on file  Stress: Not on file  Social Connections: Not on file  Intimate Partner Violence: Not on file    Outpatient Medications Prior to Visit  Medication Sig Dispense Refill  . albuterol (VENTOLIN HFA) 108 (90 Base) MCG/ACT inhaler SMARTSIG:2 Puff(s) By Mouth Every 6 Hours PRN    . Azelastine  HCl 137 MCG/SPRAY SOLN Place 1 spray into both nostrils 2 (two) times daily.    . Cetirizine HCl 10 MG CAPS Take 1 capsule by mouth as needed.    . fluticasone (FLONASE) 50 MCG/ACT nasal spray Place 2 sprays into both nostrils daily. 16 g 6  . Multiple Vitamin (MULTIVITAMIN ADULT PO) Take 1 tablet by mouth daily.    . pantoprazole (PROTONIX) 20 MG tablet Take 1 tablet (20 mg total) by mouth daily. 90 tablet 5  . methocarbamol (ROBAXIN) 500 MG tablet Take 1 tablet (500 mg total) by mouth every 8 (eight) hours as needed for muscle spasms. (Patient not taking: Reported on 06/16/2020) 60 tablet 1  . DULoxetine (CYMBALTA) 30 MG capsule Take 30 mg by mouth daily. (Patient not taking: Reported on 06/16/2020)    . FLUoxetine (PROZAC) 20 MG capsule Take 1 capsule (20 mg total) by mouth daily. 90 capsule 1  . predniSONE (STERAPRED UNI-PAK 21 TAB) 5 MG (21) TBPK tablet Take 5 mg by mouth as directed. Following pk instructions    . Vitamin D, Ergocalciferol, (DRISDOL) 1.25 MG (50000 UNIT) CAPS capsule Take 1 capsule (50,000 Units total) by mouth every 7 (seven) days. 5 capsule 6   No facility-administered medications prior to visit.    Allergies  Allergen Reactions  . Dust Mite Mixed Allergen Ext [Mite (D. Farinae)]   . Naproxen Other (See Comments)    Abd pain   . Pollen Extract     ROS Review of Systems  Constitutional: Negative.   HENT: Negative.   Respiratory: Positive for cough.   Gastrointestinal: Negative.   Endocrine: Negative for polyphagia and polyuria.  Genitourinary: Negative.   Musculoskeletal: Positive for arthralgias, neck pain and neck stiffness.  Neurological: Negative for speech difficulty and weakness.  Psychiatric/Behavioral: Positive for dysphoric mood. The patient is nervous/anxious.         Depression screen [facility] 2/9 06/16/2020 11/22/2019 11/22/2019  Decreased Interest 2 2 0  Down, Depressed, Hopeless 2 1 0  PHQ - 2 Score 4 3 0  Altered sleeping 3 2 -  Tired, decreased  energy 2 2 -  Change in appetite 2 2 -  Feeling bad or failure about yourself  2 2 -  Trouble concentrating 0 2 -  Moving slowly or fidgety/restless 0 1 -  Suicidal thoughts 0 0 -  PHQ-9 Score 13 14 -  Difficult doing work/chores Somewhat difficult Very difficult -    Objective:    Physical Exam Vitals and nursing note reviewed.  Constitutional:      General: She is not in acute distress.    Appearance: Normal appearance. She is obese. She is not ill-appearing or toxic-appearing.  HENT:     Head: Normocephalic and atraumatic.     Right Ear: Tympanic membrane, ear canal and external ear normal.     Left Ear: Tympanic membrane, ear canal and external ear normal.     Mouth/Throat:     Mouth: Mucous membranes are moist.     Pharynx: Oropharynx is clear. No oropharyngeal exudate or posterior oropharyngeal erythema.  Eyes:     General: No scleral icterus.       Right eye: No discharge.        Left eye: No discharge.     Extraocular Movements: Extraocular movements intact.     Conjunctiva/sclera: Conjunctivae normal.     Pupils: Pupils are equal, round, and reactive to light.  Cardiovascular:     Rate and Rhythm: Normal rate and regular rhythm.  Pulmonary:     Effort: Pulmonary effort is normal.     Breath sounds: Normal breath sounds.  Musculoskeletal:     Cervical back: No rigidity or tenderness. Normal range of motion.     Thoracic back: Normal range of motion.     Lumbar back: Decreased range of motion (flexion to 10").  Lymphadenopathy:     Cervical: No cervical adenopathy.  Skin:    General: Skin is warm and dry.  Neurological:     Mental Status: She is alert and oriented to person, place, and time.  Psychiatric:        Mood and Affect: Mood normal.        Behavior: Behavior normal.     BP 128/80 (BP Location: Left Arm, Patient Position: Sitting, Cuff Size: Large)   Pulse 82   Temp (!) 97.2 F (36.2 C) (Temporal)   Ht 5\' 4"  (1.626 m)   Wt 274 lb (124.3 kg)    SpO2 94%   BMI 47.03 kg/m  Wt Readings from Last 3 Encounters:  06/16/20 274 lb (124.3 kg)  11/22/19 262 lb 6.4 oz (119 kg)  10/20/19 254 lb (115.2 kg)     Health Maintenance Due  Topic Date Due  . TETANUS/TDAP  Never done  . PAP SMEAR-Modifier  Never done  . MAMMOGRAM  01/31/2010  . COVID-19 Vaccine (3 - Pfizer risk 4-dose series) 06/13/2019    There are no preventive care reminders to display for this patient.  Lab Results  Component Value Date   TSH 1.10 06/19/2020   Lab Results  Component Value Date   WBC 6.5 06/20/2020   HGB 14.4 06/20/2020   HCT 43.8 06/20/2020   MCV 89.7 06/20/2020   PLT 87.0 (L) 06/20/2020   Lab Results  Component Value Date   NA 139 06/19/2020   K 4.3 06/19/2020   CO2 26 06/19/2020   GLUCOSE 94 06/19/2020   BUN 12 06/19/2020   CREATININE 0.86 06/19/2020   BILITOT 0.4 06/19/2020   ALKPHOS 44 06/19/2020   AST 13 06/19/2020   ALT 12 06/19/2020   PROT 5.9 (L) 06/19/2020   ALBUMIN 3.8 06/19/2020   CALCIUM 8.8 06/19/2020   GFR 73.45 06/19/2020   Lab Results  Component Value Date   CHOL 181 06/19/2020   Lab Results  Component Value Date   HDL 63.90 06/19/2020   Lab Results  Component Value Date   LDLCALC 91 06/19/2020   Lab Results  Component Value Date   TRIG  126.0 06/19/2020   Lab Results  Component Value Date   CHOLHDL 3 06/19/2020   No results found for: HGBA1C    Assessment & Plan:   Problem List Items Addressed This Visit      Musculoskeletal and Integument   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[place])   Relevant Medications   DULoxetine (CYMBALTA) 30 MG capsule   Other Relevant Orders   Sedimentation rate (Completed)     Other   Class 3 severe obesity due to excess calories with body mass index (BMI) of 40.0 to 44.9 in adult ([place])   Tobacco use   Polyarthralgia   Relevant Medications   DULoxetine (CYMBALTA) 30 MG capsule   Other Relevant Orders   Ambulatory referral to Sports Medicine   Sedimentation rate (Completed)   B.  burgdorfi antibodies   Healthcare maintenance   Relevant Orders   Ambulatory referral to Gynecology   MM Digital Screening   CBC   Comprehensive metabolic panel (Completed)   Lipid panel (Completed)   Urinalysis, Routine w reflex microscopic (Completed)   Vitamin D deficiency   Relevant Orders   VITAMIN D 25 Hydroxy (Vit-D Deficiency, Fractures)   Depression with anxiety - Primary   Relevant Medications   DULoxetine (CYMBALTA) 30 MG capsule   Other Relevant Orders   TSH (Completed)   Thrombocytopenia ([place])   Relevant Orders   B. burgdorfi antibodies      Meds ordered this encounter  Medications  . DULoxetine (CYMBALTA) 30 MG capsule    Sig: Take 1 capsule (30 mg total) by mouth daily.    Dispense:  90 capsule    Refill:  1    Follow-up: Return in about 3 months (around 09/16/2020), or return fasting for blood work..  Stressed the importance of GYN follow-up.  Stressed the importance of mammogram.  Stressed the importance of compliance with her duloxetine.  We discussed that depression hurts.  Stressed the importance of weight loss with smoking cessation.  She is stressed.  I understand that.  We will chip away at this a little bit of time.  I told her I expect her to have her mammogram and Pap smear by the time I see her next.  These both have been ordered in the distant past.  I reordered these today.  Given information on health maintenance and disease prevention as well as steps to quit smoking.  Libby Maw, MD

## 2020-06-19 ENCOUNTER — Other Ambulatory Visit: Payer: Self-pay

## 2020-06-19 ENCOUNTER — Ambulatory Visit (HOSPITAL_BASED_OUTPATIENT_CLINIC_OR_DEPARTMENT_OTHER): Payer: 59 | Admitting: Radiology

## 2020-06-19 ENCOUNTER — Other Ambulatory Visit (INDEPENDENT_AMBULATORY_CARE_PROVIDER_SITE_OTHER): Payer: 59

## 2020-06-19 DIAGNOSIS — M353 Polymyalgia rheumatica: Secondary | ICD-10-CM | POA: Diagnosis not present

## 2020-06-19 DIAGNOSIS — M2550 Pain in unspecified joint: Secondary | ICD-10-CM | POA: Diagnosis not present

## 2020-06-19 DIAGNOSIS — Z0000 Encounter for general adult medical examination without abnormal findings: Secondary | ICD-10-CM

## 2020-06-19 DIAGNOSIS — F418 Other specified anxiety disorders: Secondary | ICD-10-CM

## 2020-06-19 LAB — COMPREHENSIVE METABOLIC PANEL
ALT: 12 U/L (ref 0–35)
AST: 13 U/L (ref 0–37)
Albumin: 3.8 g/dL (ref 3.5–5.2)
Alkaline Phosphatase: 44 U/L (ref 39–117)
BUN: 12 mg/dL (ref 6–23)
CO2: 26 mEq/L (ref 19–32)
Calcium: 8.8 mg/dL (ref 8.4–10.5)
Chloride: 106 mEq/L (ref 96–112)
Creatinine, Ser: 0.86 mg/dL (ref 0.40–1.20)
GFR: 73.45 mL/min (ref >60.00)
Glucose, Bld: 94 mg/dL (ref 70–99)
Potassium: 4.3 mEq/L (ref 3.5–5.1)
Sodium: 139 mEq/L (ref 135–145)
Total Bilirubin: 0.4 mg/dL (ref 0.2–1.2)
Total Protein: 5.9 g/dL — ABNORMAL LOW (ref 6.0–8.3)

## 2020-06-19 LAB — URINALYSIS, ROUTINE W REFLEX MICROSCOPIC
Bilirubin Urine: NEGATIVE
Hgb urine dipstick: NEGATIVE
Ketones, ur: NEGATIVE
Leukocytes,Ua: NEGATIVE
Nitrite: NEGATIVE
RBC / HPF: NONE SEEN (ref 0–?)
Specific Gravity, Urine: 1.025 (ref 1.000–1.030)
Total Protein, Urine: NEGATIVE
Urine Glucose: NEGATIVE
Urobilinogen, UA: 0.2 (ref 0.0–1.0)
WBC, UA: NONE SEEN (ref 0–?)
pH: 6 (ref 5.0–8.0)

## 2020-06-19 LAB — SEDIMENTATION RATE: Sed Rate: 7 mm/hr (ref 0–30)

## 2020-06-19 LAB — LIPID PANEL
Cholesterol: 181 mg/dL (ref 0–200)
HDL: 63.9 mg/dL (ref >39.00)
LDL Cholesterol: 91 mg/dL (ref 0–99)
NonHDL: 116.62
Total CHOL/HDL Ratio: 3
Triglycerides: 126 mg/dL (ref 0.0–149.0)
VLDL: 25.2 mg/dL (ref 0.0–40.0)

## 2020-06-19 LAB — TSH: TSH: 1.1 u[IU]/mL (ref 0.35–4.50)

## 2020-06-20 ENCOUNTER — Ambulatory Visit (HOSPITAL_BASED_OUTPATIENT_CLINIC_OR_DEPARTMENT_OTHER)
Admission: RE | Admit: 2020-06-20 | Discharge: 2020-06-20 | Disposition: A | Discharge status: Home / Self Care | Payer: 59 | Source: Ambulatory Visit | Attending: Family Medicine | Admitting: Family Medicine

## 2020-06-20 ENCOUNTER — Other Ambulatory Visit: Payer: Self-pay

## 2020-06-20 ENCOUNTER — Other Ambulatory Visit (INDEPENDENT_AMBULATORY_CARE_PROVIDER_SITE_OTHER): Payer: 59

## 2020-06-20 DIAGNOSIS — Z0000 Encounter for general adult medical examination without abnormal findings: Secondary | ICD-10-CM

## 2020-06-20 DIAGNOSIS — Z1231 Encounter for screening mammogram for malignant neoplasm of breast: Secondary | ICD-10-CM | POA: Insufficient documentation

## 2020-06-20 LAB — CBC WITH DIFFERENTIAL/PLATELET
Basophils Absolute: 0.1 10*3/uL (ref 0.0–0.1)
Basophils Relative: 1.4 % (ref 0.0–3.0)
Eosinophils Absolute: 0.2 10*3/uL (ref 0.0–0.7)
Eosinophils Relative: 3.6 % (ref 0.0–5.0)
HCT: 43.8 % (ref 36.0–46.0)
Hemoglobin: 14.4 g/dL (ref 12.0–15.0)
Lymphocytes Relative: 25.5 % (ref 12.0–46.0)
Lymphs Abs: 1.8 10*3/uL (ref 0.7–4.0)
MCHC: 32.8 g/dL (ref 30.0–36.0)
MCV: 89.7 fl (ref 78.0–100.0)
Monocytes Absolute: 0.4 10*3/uL (ref 0.1–1.0)
Monocytes Relative: 5.4 % (ref 3.0–12.0)
Neutro Abs: 4.4 10*3/uL (ref 1.4–7.7)
Neutrophils Relative %: 64.1 % (ref 43.0–77.0)
Platelets: 87 10*3/uL — ABNORMAL LOW (ref 150.0–400.0)
RBC: 4.88 Mil/uL (ref 3.87–5.11)
RDW: 13.8 % (ref 11.5–15.5)
WBC: 6.5 10*3/uL (ref 4.0–10.5)

## 2020-06-20 LAB — SEDIMENTATION RATE: Sed Rate: 11 mm/hr (ref 0–30)

## 2020-06-20 NOTE — Progress Notes (Addendum)
Per orders of Dr. Ethelene Hal pt is here for redraw on labs due to clotting labs tolerated draw well.

## 2020-06-20 NOTE — Progress Notes (Signed)
Per orders of Dr. Ethelene Hal pt is here for labs, pt tolerated draw well.

## 2020-06-21 DIAGNOSIS — D696 Thrombocytopenia, unspecified: Secondary | ICD-10-CM | POA: Insufficient documentation

## 2020-06-21 NOTE — Addendum Note (Signed)
Addended by: Jon Billings on: 06/21/2020 09:06 AM   Modules accepted: Orders

## 2020-06-27 ENCOUNTER — Telehealth: Payer: Self-pay | Admitting: Family Medicine

## 2020-06-27 NOTE — Telephone Encounter (Signed)
Duplicate message call returned

## 2020-06-27 NOTE — Telephone Encounter (Signed)
Pt is wanting a cb concerning her most recent lab results. Please advise pt at [phone].

## 2020-06-28 ENCOUNTER — Other Ambulatory Visit: Payer: Self-pay

## 2020-06-28 ENCOUNTER — Ambulatory Visit (INDEPENDENT_AMBULATORY_CARE_PROVIDER_SITE_OTHER): Payer: 59 | Admitting: Family Medicine

## 2020-06-28 VITALS — BP 139/68 | Ht 65.0 in | Wt 270.0 lb

## 2020-06-28 DIAGNOSIS — M5010 Cervical disc disorder with radiculopathy, unspecified cervical region: Secondary | ICD-10-CM

## 2020-06-28 DIAGNOSIS — M5416 Radiculopathy, lumbar region: Secondary | ICD-10-CM

## 2020-06-28 DIAGNOSIS — M353 Polymyalgia rheumatica: Secondary | ICD-10-CM | POA: Diagnosis not present

## 2020-06-28 NOTE — Progress Notes (Addendum)
PCP: Libby Maw, MD  Subjective:   HPI: Patient is a 60 y.o. female with history of Obesity, anxiety/depression here for evaluation of diffuse joint pains.  She reports that she has had intermittent joint pains for many years, however over the past few years she has had worsening joint pains.  She states that her initial pain was mostly in her right arm.  She describes as a numbness and tingling that went down her whole arm.  She continues to have pain in this area and describes as a sharp shooting pain.  She has intermittent swelling in her right hand especially at the MCP joints.  She has had some similar symptoms on the left side but minimally so.  She does describe some neck pain and stiffness as well.  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  She has received intermittent steroids recently for this and reports that is helpful when she is taking them but returns when she stops taking them.  She is unable to take NSAIDs due to history of gastric ulcer.  She is taking Tylenol which is somewhat helpful.  She also notes that she has taken her sister's Humira (her sister has RA) a few times in the past and thinks it may have been helpful.  She has not noticed any weakness in her upper extremities.  Over the last year or so, she has also started to have pain going down her right leg.  She describes as pain in her gluteal musculature that extends down the back of her leg to her knee.  Is a numb and tingly sensation, and a shooting sensation that is worse when she is bending forward.  Patient also notes that she has gained about 50 pounds in the last year and cites many life stressors as the cause of her weight gain.    Review of Systems:  Per HPI.   [place],  medications and smoking status reviewed.      Objective:  Physical Exam:  No flowsheet data found.   Gen: awake, alert, NAD, comfortable in exam room Pulm: breathing unlabored  Cervical spine/upper extremities: -Inspection: Mild loss of lordosis.  No erythema, ecchymoses or edema.  She does have some mild swelling at the MCP joints of the right hand. -Palpation: Mild tenderness to palpation diffusely in the paraspinal musculature.  Muscle tension in the right greater than left trapezius musculature.  No abnormalities on palpation of the right upper extremity. -ROM: Full range of motion of the cervical spine without significant pain.  Forage motion of the shoulder elbow and wrist without significant pain. -Strength: 5/5 strength in bilateral upper extremities and in cervical spine. -Special: Mildly positive Spurling's with reproduction of right-sided shooting pains.  Negative Tinel's at the elbow and median nerve of the wrist  Lumbar spine:  Inspection: No evidence of erythema, ecchymosis, swelling edema.  Palpation: No midline spinal tenderness. Nontender to facets. No paraspinal tenderness of the lumbar spines. Nontender to SI joints.  ROM: Intact to forward flexion, extension, rotation, and bending.  Reproduction of right-sided leg pain with forward bending. Special tests: Positive straight leg raise on the right    Assessment & Plan:  1.  Right cervical radiculopathy Differential for patient's right upper extremity  pains includes cervical radiculopathy, osteoarthritis at multiple levels, autoimmune/inflammatory condition, or distal nerve impingement such as cubital tunnel/carpal tunnel.  It is fairly difficult to localize given the description of diffuse shooting pain across her whole arm and hand.  Given her description of her pain in the diffuseness across her whole arm, along with neck pain I suspect cervical radiculopathy.  She does however have some swelling at the MCP joints and  sparing the DIP joints along with some family history of rheumatoid arthritis raising the possibility of autoimmune condition.  She has had 2 normal ESR's in the past and the unilaterality is not common for RA, but I will keep this on the differential.  Plan: -X-ray cervical spine, 2 views -Increase duloxetine to 60 mg daily -If x-ray suggest DDD, would consider PT referral  2.  Right lumbar radiculopathy Patient with fairly classic signs symptoms of lumbar radiculopathy.  I suspect that her recent weight gain may have exacerbated her underlying lumbar degenerative disc disease and caused impingement.  Plan: -X-ray lumbar spine, 2 views -Increase duloxetine to 60 mg daily -If x-ray suggest DDD, would consider PT referral -Discussed the importance of weight loss and she is in agreement with this   Dagoberto Ligas, MD [facility] Fellow 06/28/2020 1:57 PM  Addendum:  I was the preceptor for this visit and available for immediate consultation.  Karlton Lemon MD CAQSM  Addendum 07/07/20: I spoke with patient at length about her x-rays of her lumbar spine and cervical spine.  In summary, lumbar spine does show degenerative disc disease with grade 2 anterolisthesis at L5-S1 and grade 1 anterolisthesis at L4-L5 which could be explaining her right radicular symptoms.  She also has diffuse degenerative disc disease at multiple levels in the cervical spine which likely explain her cervical radiculopathy as well.  We discussed options including PT referral or more aggressive treatment with epidural steroid injections.  She would like to proceed with PT for now.  She also asked about FMLA paperwork for work, I discussed that we should schedule follow-up appointment to discuss this.  Dagoberto Ligas, MD Sports Medicine Fellow, [place]

## 2020-06-28 NOTE — Patient Instructions (Signed)
Thank you for coming in to see [place] today!  I believe that you have cervical radiculopathy and lumbar radiculopathy- pinched nerves in your neck and back.  Please see below to review our plan for today's visit:   1.   Please go to [facility] imaging to get x-rays of your neck and low back 2.   Please increase your duloxetine to 60 mg daily 3.   I will call you with the results of your x-ray and make further plans based on those results.   Please call the clinic at [phone] if your symptoms worsen or you have any concerns. It was our pleasure to serve you.       Dr. Dagoberto Ligas [facility]\

## 2020-06-29 ENCOUNTER — Telehealth: Payer: Self-pay

## 2020-06-29 ENCOUNTER — Encounter: Payer: Self-pay | Admitting: Family Medicine

## 2020-06-29 ENCOUNTER — Other Ambulatory Visit (INDEPENDENT_AMBULATORY_CARE_PROVIDER_SITE_OTHER): Payer: 59

## 2020-06-29 DIAGNOSIS — M353 Polymyalgia rheumatica: Secondary | ICD-10-CM

## 2020-06-29 DIAGNOSIS — D696 Thrombocytopenia, unspecified: Secondary | ICD-10-CM | POA: Diagnosis not present

## 2020-06-29 DIAGNOSIS — Z0000 Encounter for general adult medical examination without abnormal findings: Secondary | ICD-10-CM

## 2020-06-29 LAB — TSH: TSH: 0.83 u[IU]/mL (ref 0.35–4.50)

## 2020-06-29 NOTE — Telephone Encounter (Signed)
Patient in office today for additional labs, per Sandra Stafford, for the B burgorfi Antibodies test she cannot release the orders from telephone visits. That order will need to be added to today's visit if Dr. Ethelene Hal wants it done.Please advise

## 2020-06-29 NOTE — Progress Notes (Signed)
Per orders of Dr. Ethelene Hal pt is here for repeat labs pt tolerated draw well.

## 2020-06-30 LAB — COMPREHENSIVE METABOLIC PANEL
ALT: 14 U/L (ref 0–35)
AST: 15 U/L (ref 0–37)
Albumin: 3.7 g/dL (ref 3.5–5.2)
Alkaline Phosphatase: 49 U/L (ref 39–117)
BUN: 9 mg/dL (ref 6–23)
CO2: 23 mEq/L (ref 19–32)
Calcium: 8.7 mg/dL (ref 8.4–10.5)
Chloride: 107 mEq/L (ref 96–112)
Creatinine, Ser: 0.65 mg/dL (ref 0.40–1.20)
GFR: 95.7 mL/min (ref >60.00)
Glucose, Bld: 95 mg/dL (ref 70–99)
Potassium: 4.1 mEq/L (ref 3.5–5.1)
Sodium: 141 mEq/L (ref 135–145)
Total Bilirubin: 0.3 mg/dL (ref 0.2–1.2)
Total Protein: 5.7 g/dL — ABNORMAL LOW (ref 6.0–8.3)

## 2020-06-30 LAB — LIPID PANEL
Cholesterol: 188 mg/dL (ref 0–200)
HDL: 60.6 mg/dL (ref >39.00)
LDL Cholesterol: 98 mg/dL (ref 0–99)
NonHDL: 126.93
Total CHOL/HDL Ratio: 3
Triglycerides: 143 mg/dL (ref 0.0–149.0)
VLDL: 28.6 mg/dL (ref 0.0–40.0)

## 2020-06-30 LAB — B. BURGDORFI ANTIBODIES: B burgdorferi Ab IgG+IgM: <0.9 index

## 2020-07-03 NOTE — Progress Notes (Signed)
Labs were mostly okay. Platelets were low. Will recheck them in August.

## 2020-07-04 ENCOUNTER — Ambulatory Visit
Admission: RE | Admit: 2020-07-04 | Discharge: 2020-07-04 | Disposition: A | Discharge status: Home / Self Care | Payer: 59 | Source: Ambulatory Visit | Attending: Family Medicine | Admitting: Family Medicine

## 2020-07-04 DIAGNOSIS — M353 Polymyalgia rheumatica: Secondary | ICD-10-CM

## 2020-07-07 ENCOUNTER — Other Ambulatory Visit: Payer: Self-pay

## 2020-07-07 DIAGNOSIS — M5010 Cervical disc disorder with radiculopathy, unspecified cervical region: Secondary | ICD-10-CM

## 2020-07-07 DIAGNOSIS — M5416 Radiculopathy, lumbar region: Secondary | ICD-10-CM

## 2020-07-07 NOTE — Addendum Note (Signed)
Addended by: Jolinda Croak E on: 07/07/2020 11:08 AM   Modules accepted: Orders

## 2020-07-18 ENCOUNTER — Other Ambulatory Visit (HOSPITAL_COMMUNITY)
Admission: RE | Admit: 2020-07-18 | Discharge: 2020-07-18 | Disposition: A | Discharge status: Home / Self Care | Payer: 59 | Source: Ambulatory Visit | Attending: Nurse Practitioner | Admitting: Nurse Practitioner

## 2020-07-18 ENCOUNTER — Encounter: Payer: Self-pay | Admitting: Nurse Practitioner

## 2020-07-18 ENCOUNTER — Other Ambulatory Visit: Payer: Self-pay

## 2020-07-18 ENCOUNTER — Ambulatory Visit: Payer: 59 | Admitting: Physical Therapy

## 2020-07-18 ENCOUNTER — Ambulatory Visit: Payer: 59 | Admitting: Nurse Practitioner

## 2020-07-18 VITALS — BP 110/70 | HR 111 | Resp 16 | Ht 62.75 in | Wt 265.0 lb

## 2020-07-18 DIAGNOSIS — Z01419 Encounter for gynecological examination (general) (routine) without abnormal findings: Secondary | ICD-10-CM

## 2020-07-18 DIAGNOSIS — R3915 Urgency of urination: Secondary | ICD-10-CM

## 2020-07-18 NOTE — Patient Instructions (Signed)
Health Maintenance, Female Adopting a healthy lifestyle and getting preventive care are important in promoting health and wellness. Ask your health care provider about: The right schedule for you to have regular tests and exams. Things you can do on your own to prevent diseases and keep yourself healthy. What should I know about diet, weight, and exercise? Eat a healthy diet  Eat a diet that includes plenty of vegetables, fruits, low-fat dairy products, and lean protein. Do not eat a lot of foods that are high in solid fats, added sugars, or sodium.  Maintain a healthy weight Body mass index (BMI) is used to identify weight problems. It estimates body fat based on height and weight. Your health care provider can help determineyour BMI and help you achieve or maintain a healthy weight. Get regular exercise Get regular exercise. This is one of the most important things you can do for your health. Most adults should: Exercise for at least 150 minutes each week. The exercise should increase your heart rate and make you sweat (moderate-intensity exercise). Do strengthening exercises at least twice a week. This is in addition to the moderate-intensity exercise. Spend less time sitting. Even light physical activity can be beneficial. Watch cholesterol and blood lipids Have your blood tested for lipids and cholesterol at 60 years of age, then havethis test every 5 years. Have your cholesterol levels checked more often if: Your lipid or cholesterol levels are high. You are older than 60 years of age. You are at high risk for heart disease. What should I know about cancer screening? Depending on your health history and family history, you may need to have cancer screening at various ages. This may include screening for: Breast cancer. Cervical cancer. Colorectal cancer. Skin cancer. Lung cancer. What should I know about heart disease, diabetes, and high blood pressure? Blood pressure and heart  disease High blood pressure causes heart disease and increases the risk of stroke. This is more likely to develop in people who have high blood pressure readings, are of African descent, or are overweight. Have your blood pressure checked: Every 3-5 years if you are 18-39 years of age. Every year if you are 40 years old or older. Diabetes Have regular diabetes screenings. This checks your fasting blood sugar level. Have the screening done: Once every three years after age 40 if you are at a normal weight and have a low risk for diabetes. More often and at a younger age if you are overweight or have a high risk for diabetes. What should I know about preventing infection? Hepatitis B If you have a higher risk for hepatitis B, you should be screened for this virus. Talk with your health care provider to find out if you are at risk forhepatitis B infection. Hepatitis C Testing is recommended for: Everyone born from 1945 through 1965. Anyone with known risk factors for hepatitis C. Sexually transmitted infections (STIs) Get screened for STIs, including gonorrhea and chlamydia, if: You are sexually active and are younger than 60 years of age. You are older than 60 years of age and your health care provider tells you that you are at risk for this type of infection. Your sexual activity has changed since you were last screened, and you are at increased risk for chlamydia or gonorrhea. Ask your health care provider if you are at risk. Ask your health care provider about whether you are at high risk for HIV. Your health care provider may recommend a prescription medicine to help   prevent HIV infection. If you choose to take medicine to prevent HIV, you should first get tested for HIV. You should then be tested every 3 months for as long as you are taking the medicine. Pregnancy If you are about to stop having your period (premenopausal) and you may become pregnant, seek counseling before you get  pregnant. Take 400 to 800 micrograms (mcg) of folic acid every day if you become pregnant. Ask for birth control (contraception) if you want to prevent pregnancy. Osteoporosis and menopause Osteoporosis is a disease in which the bones lose minerals and strength with aging. This can result in bone fractures. If you are 65 years old or older, or if you are at risk for osteoporosis and fractures, ask your health care provider if you should: Be screened for bone loss. Take a calcium or vitamin D supplement to lower your risk of fractures. Be given hormone replacement therapy (HRT) to treat symptoms of menopause. Follow these instructions at home: Lifestyle Do not use any products that contain nicotine or tobacco, such as cigarettes, e-cigarettes, and chewing tobacco. If you need help quitting, ask your health care provider. Do not use street drugs. Do not share needles. Ask your health care provider for help if you need support or information about quitting drugs. Alcohol use Do not drink alcohol if: Your health care provider tells you not to drink. You are pregnant, may be pregnant, or are planning to become pregnant. If you drink alcohol: Limit how much you use to 0-1 drink a day. Limit intake if you are breastfeeding. Be aware of how much alcohol is in your drink. In the [place] one drink equals one 12 oz bottle of beer (355 mL), one 5 oz glass of wine (148 mL), or one 1 oz glass of hard liquor (44 mL). General instructions Schedule regular health, dental, and eye exams. Stay current with your vaccines. Tell your health care provider if: You often feel depressed. You have ever been abused or do not feel safe at home. Summary Adopting a healthy lifestyle and getting preventive care are important in promoting health and wellness. Follow your health care provider's instructions about healthy diet, exercising, and getting tested or screened for diseases. Follow your health care provider's  instructions on monitoring your cholesterol and blood pressure. This information is not intended to replace advice given to you by your health care provider. Make sure you discuss any questions you have with your healthcare provider. Document Revised: 01/07/2018 Document Reviewed: 01/07/2018 Elsevier Patient Education  2022 [employer] Inc[employer]  

## 2020-07-18 NOTE — Progress Notes (Signed)
60 y.o. G16P1020 Single Black or [place] American female here for annual exam.     No LMP recorded. Patient has had a hysterectomy.    S/P hyst for persistent pre-cancer cells. (Unable to find pathology report), was done about 30 years ago.  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 This was done in 2003. Followed up for a few years and has not been back to Gynecologist since then.     Sometimes has difficulty emptying her bladder x couple years. Usually tries to void 2-3 positions and if she strains, she eventually can do it.   She works at [employer], and has several roles and it is hard to get away and go to the bathroom, so she feels like it has contributed to this issue.   Sexually active: No.  The current method of family planning is status post hysterectomy.    Exercising: Yes.     Some at work Smoker:  yes  Health Maintenance: Pap:  many yrs ago History of abnormal Pap:  per patient had laser MMG:  06-20-20 category b density birads 1:neg Colonoscopy:  09-20-19 f/u 73yrs BMD:   none Gardasil:   n/a Covid-19: pfizer Hep C testing: neg 2021 Screening Labs: recently done with Dr. Ethelene Hal   reports that she has been smoking cigarettes. She has never used smokeless tobacco. She reports previous alcohol use. She reports that she does not use drugs.  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   Qualifier: Diagnosis of  By: Enrique Sack     Class 3 severe obesity due to excess calories with body mass index (BMI) of 40.0 to 44.9 in adult ([place]) 11/03/2006   Qualifier: Diagnosis of  By: Enrique Sack     Depression    Depression with anxiety 06/02/2019   DOE (dyspnea on exertion) 08/04/2019   DYSPEPSIA 11/03/2006    Qualifier: History of  By: Enrique Sack     Fibroid    Genital warts    GERD (gastroesophageal reflux disease)    Healthcare maintenance 05/17/2019   Heart murmur 07/05/2019   Medication side effect 10/20/2019   Muscle cramps 06/02/2019   Need for 23-polyvalent pneumococcal polysaccharide vaccine 10/20/2019   Need for influenza vaccination 10/20/2019   Polyarthralgia 05/17/2019   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[place]) 05/17/2019   PUD 11/03/2006   Qualifier: Diagnosis of  By: Enrique Sack     Seasonal allergic rhinitis due to pollen 06/02/2019   STD (sexually transmitted disease)    hpv (genital warts   Stress incontinence 11/22/2019   Tobacco use 11/03/2006   Qualifier: Diagnosis of  By: Enrique Sack     VENEREAL WART 11/03/2006   Qualifier: Diagnosis of  By: Enrique Sack     Vitamin D deficiency 05/22/2019    Past Surgical History:  Procedure Laterality Date   ABDOMINAL HYSTERECTOMY     CERVIX LESION DESTRUCTION     CESAREAN SECTION     SALPINGECTOMY      Current Outpatient Medications  Medication Sig Dispense Refill   Acetaminophen (TYLENOL ARTHRITIS PAIN PO) Take by mouth.     albuterol (VENTOLIN HFA) 108 (90 Base) MCG/ACT inhaler SMARTSIG:2 Puff(s) By Mouth Every  6 Hours PRN     Cetirizine HCl 10 MG CAPS Take 1 capsule by mouth as needed.     DULoxetine (CYMBALTA) 30 MG capsule Take 1 capsule (30 mg total) by mouth daily. (Patient taking differently: Take 60 mg by mouth daily.) 90 capsule 1   pantoprazole (PROTONIX) 20 MG tablet Take 1 tablet (20 mg total) by mouth daily. 90 tablet 5   methocarbamol (ROBAXIN) 500 MG tablet Take 1 tablet (500 mg total) by mouth every 8 (eight) hours as needed for muscle spasms. (Patient not taking: No sig reported) 60 tablet 1   No current facility-administered medications for this visit.    Family History  Problem Relation Age of Onset   Cancer Mother    Diabetes Mother    Hypertension Mother    Diabetes Father    Colon cancer  Cousin     Review of Systems  Constitutional: Negative.   HENT: Negative.    Eyes: Negative.   Respiratory: Negative.    Cardiovascular: Negative.   Gastrointestinal: Negative.   Endocrine: Negative.   Genitourinary: Negative.   Musculoskeletal: Negative.   Skin: Negative.   Allergic/Immunologic: Negative.   Neurological: Negative.   Hematological: Negative.   Psychiatric/Behavioral: Negative.     Exam:   BP 110/70   Pulse (!) 111   Resp 16   Ht 5' 2.75" (1.594 m)   Wt 265 lb (120.2 kg)   BMI 47.32 kg/m   Height: 5' 2.75" (159.4 cm)  General appearance: alert, cooperative and appears stated age, no acute distress Head: Normocephalic, without obvious abnormality Neck: no adenopathy, thyroid normal to inspection and palpation Lungs: clear to auscultation bilaterally Breasts: normal appearance, no masses or tenderness Heart: regular rate and rhythm Abdomen: soft, non-tender; no masses,  no organomegaly Extremities: extremities normal, no edema Skin: No rashes or lesions Lymph nodes: Cervical, supraclavicular, and axillary nodes normal. No abnormal inguinal nodes palpated Neurologic: Grossly normal   Pelvic: External genitalia:  no lesions              Urethra:  normal appearing urethra with no masses, tenderness or lesions              Bartholins and Skenes: normal                 Vagina: normal appearing vagina, appropriate for age, normal appearing discharge, no lesions              Cervix: absent             Bimanual Exam:   Uterus:  uterus absent              Adnexa: no mass, fullness, tenderness                 Joy, CMA Chaperone was present for exam.  A:   Well woman exam with routine gynecological exam - Plan: Cytology - PAP( [facility])-pap done because hyst done for reason of "persistent pre-cancer cells, s/p VIN III ([facility])  Urinary urgency/urinary retention - Plan: Urinalysis,Complete w/RFL Culture   P:   Pap :done today  Mammogram:done 05/2020, next  due 05/2021  Labs:no blood work needed today  Medications: no new  Dexa: start age 68  Colorectal screening- due 2026  Encouraged daily exercise, try water aerobics at the [facility], sounds like your mom would be a good partner.  Encouraged smoking cessation  F/U 1 year

## 2020-07-19 LAB — CYTOLOGY - PAP
Comment: NEGATIVE
Diagnosis: NEGATIVE
High risk HPV: NEGATIVE

## 2020-07-20 LAB — URINALYSIS, COMPLETE W/RFL CULTURE
Bilirubin Urine: NEGATIVE
Glucose, UA: NEGATIVE
Hgb urine dipstick: NEGATIVE
Hyaline Cast: NONE SEEN /LPF
Leukocyte Esterase: NEGATIVE
Nitrites, Initial: NEGATIVE
Protein, ur: NEGATIVE
RBC / HPF: NONE SEEN /HPF (ref 0–2)
Specific Gravity, Urine: 1.023 (ref 1.001–1.035)
pH: 5 (ref 5.0–8.0)

## 2020-07-20 LAB — URINE CULTURE
MICRO NUMBER:: 12032048
Result:: NO GROWTH
SPECIMEN QUALITY:: ADEQUATE

## 2020-07-20 LAB — CULTURE INDICATED

## 2020-07-24 ENCOUNTER — Ambulatory Visit: Payer: 59 | Attending: Family Medicine | Admitting: Physical Therapy

## 2020-07-24 ENCOUNTER — Encounter: Payer: Self-pay | Admitting: Physical Therapy

## 2020-07-24 ENCOUNTER — Other Ambulatory Visit: Payer: Self-pay

## 2020-07-24 DIAGNOSIS — M6281 Muscle weakness (generalized): Secondary | ICD-10-CM | POA: Insufficient documentation

## 2020-07-24 DIAGNOSIS — R293 Abnormal posture: Secondary | ICD-10-CM | POA: Diagnosis present

## 2020-07-24 DIAGNOSIS — M5416 Radiculopathy, lumbar region: Secondary | ICD-10-CM | POA: Insufficient documentation

## 2020-07-24 DIAGNOSIS — M5412 Radiculopathy, cervical region: Secondary | ICD-10-CM | POA: Diagnosis not present

## 2020-07-24 NOTE — Patient Instructions (Addendum)
      Access Code: [email or web address] URL: [email or web address] Date: 07/24/2020 Prepared by: Annie Paras  Exercises Seated Hamstring Stretch - 2-3 x daily - 7 x weekly - 3 reps - 30 sec hold Seated Hip Flexor Stretch - 2-3 x daily - 7 x weekly - 3 reps - 30 sec hold Seated Flexion Stretch with Swiss Ball - 2-3 x daily - 7 x weekly - 3 reps - 30 sec hold Seated Thoracic Flexion and Rotation with Swiss Ball - 2-3 x daily - 7 x weekly - 2 sets - 3 reps - 30 sec hold Seated Cervical Retraction - 2 x daily - 7 x weekly - 2 sets - 10 reps - 3-5 sec hold Seated Gentle Upper Trapezius Stretch - 2-3 x daily - 7 x weekly - 3 reps - 30 sec hold Seated Scapular Retraction - 3 x daily - 7 x weekly - 2 sets - 10 reps - 5 sec hold  Patient Education Brachial plexus nerve glide

## 2020-07-24 NOTE — Therapy (Addendum)
[facility] [address]  [place], [place], [address] Phone: [phone]   Fax:  [phone]  Physical Therapy Evaluation / Discharge Summary  Patient Details  Name: Sandra Stafford MRN: [id number] Date of Birth: 1960/09/01 Referring Provider (PT): Dagoberto Ligas, MD   Encounter Date: 07/24/2020   PT End of Session - 07/24/20 1056     Visit Number 1    Number of Visits 16    Date for PT Re-Evaluation 09/18/20    Authorization Type [employer] - VL: 30    PT Start Time 1056    PT Stop Time 1213    PT Time Calculation (min) 77 min    Activity Tolerance Patient tolerated treatment well;Patient limited by pain    Behavior During Therapy [facility] for tasks assessed/performed;Flat affect             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   Qualifier: Diagnosis of  By: Enrique Sack     Class 3 severe obesity due to excess calories with body mass index (BMI) of 40.0 to 44.9 in adult ([place]) 11/03/2006   Qualifier: Diagnosis of  By: Enrique Sack     Depression    Depression with anxiety 06/02/2019   DOE (dyspnea on exertion) 08/04/2019   DYSPEPSIA 11/03/2006   Qualifier: History of  By: Enrique Sack     Fibroid    Genital warts    GERD (gastroesophageal reflux disease)    Healthcare maintenance 05/17/2019   Heart murmur 07/05/2019   Medication side effect 10/20/2019   Muscle cramps 06/02/2019   Need for 23-polyvalent pneumococcal polysaccharide vaccine 10/20/2019   Need for influenza vaccination 10/20/2019   Polyarthralgia 05/17/2019   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[place]) 05/17/2019   PUD 11/03/2006   Qualifier: Diagnosis of  By: Enrique Sack     Seasonal allergic rhinitis due to  pollen 06/02/2019   STD (sexually transmitted disease)    hpv (genital warts   Stress incontinence 11/22/2019   Tobacco use 11/03/2006   Qualifier: Diagnosis of  By: Enrique Sack     VENEREAL WART 11/03/2006   Qualifier: Diagnosis of  By: Enrique Sack     Vitamin D deficiency 05/22/2019    Past Surgical History:  Procedure Laterality Date   ABDOMINAL HYSTERECTOMY     CERVIX LESION DESTRUCTION     CESAREAN SECTION     SALPINGECTOMY      There were no vitals filed for this visit.    Subjective Assessment - 07/24/20 1100     Subjective Pt reports a lot of lower back, leg, hand and and shoulder pain for ~2 yrs now. 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    Limitations Sitting;Standing;Walking;House hold activities;Lifting    How long can you sit comfortably? 20-30 minutes    How long can you stand comfortably? up to 1 hr    Diagnostic tests 07/04/20 - Cervical x-ray: Mild appearing C4-7 cervical degenerative disease.  Lumbar x-ray: Probable rudimentary ribs at the T12 level. 8 mm grade 2  anterolisthesis L5 on S1. 4 mm grade 1 anterolisthesis L3 on L4.  Multilevel intervertebral disc height loss most pronounced at  T12-L1. Lower lumbar facet arthrosis. Scattered abdominal aortic  atherosclerotic calcification.    Patient Stated Goals not sure    Currently in Pain? Yes    Pain Score 5    up to 10/10 at times   Pain Location Hand    Pain Orientation Right;Left   R>L   Pain Descriptors / Indicators Numbness;Tingling    Pain Type Chronic pain    Pain Radiating Towards shooting pain from shoulders to/from hands    Pain Onset More than a month ago   ~2 yrs   Pain Frequency Intermittent    Aggravating Factors  driving, holding something like a broom, sleeping    Pain Relieving Factors exercise/moving her fingers; Tylenol Arthritis    Effect of Pain  on Daily Activities can hurt to breath when it gets bad; "trys to go with the flow"    Pain Score 6   up to 8-10/10   Pain Location Back    Pain Orientation Lower;Right;Left    Pain Descriptors / Indicators Throbbing;Aching    Pain Type Chronic pain    Pain Radiating Towards muscle spasm in legs; intermittent numbness and tingling into LEs (R>L)    Pain Onset More than a month ago   ~2 yrs   Pain Frequency Intermittent    Aggravating Factors  standing at work; prolonged sitting    Pain Relieving Factors sitting down when she can at work; fwd flexion over a table; shifting her weight around    Effect of Pain on Daily Activities sometimes interferes with everything - has missed workdays d/t pain & muscle spasms (missed 3 out of 4 days last week)                [facility] PT Assessment - 07/24/20 1056       Assessment   Medical Diagnosis Chronic neck and back pain with cervical & lumbar radiculopathy    Referring Provider (PT) Dagoberto Ligas, MD    Onset Date/Surgical Date --   ~2 yrs   Hand Dominance Right    Next MD Visit 07/26/20    Prior Therapy none      Precautions   Precautions None      Restrictions   Weight Bearing Restrictions No      Balance Screen   Has the patient fallen in the past 6 months No    Has the patient had a decrease in activity level because of a fear of falling?  No    Is the patient reluctant to leave their home because of a fear of falling?  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    Leisure [place], clean  if she feels like it      Cognition   Overall Cognitive Status Within Functional Limits for  tasks assessed      Posture/Postural Control   Posture/Postural Control Postural limitations    Postural Limitations Forward head;Rounded Shoulders;Increased thoracic kyphosis;Decreased lumbar lordosis;Flexed trunk      ROM / Strength   AROM / PROM / Strength AROM;Strength      AROM   Overall AROM Comments B shoulder AROM mild to moderately limited with pain in all motions    AROM Assessment Site Cervical;Lumbar;Shoulder    Right/Left Shoulder Right;Left    Right Shoulder Flexion 155 Degrees    Right Shoulder ABduction 150 Degrees    Left Shoulder Flexion 150 Degrees    Left Shoulder ABduction 149 Degrees    Cervical Flexion 50    Cervical Extension 36 - stiff with pain into L shoulder & arm    Cervical - Right Side Bend 21    Cervical - Left Side Bend 29    Cervical - Right Rotation 66    Cervical - Left Rotation 50    Lumbar Flexion hands to mid shins - HS tightness    Lumbar Extension 50% limited - pain    Lumbar - Right Side Bend WFL    Lumbar - Left Side Bend WFL    Lumbar - Right Rotation 50% limited - pain    Lumbar - Left Rotation 50% limited - pain      Strength   Strength Assessment Site Shoulder;Hand;Hip;Knee;Ankle    Right/Left Shoulder Right;Left    Right Shoulder Flexion 4/5    Right Shoulder ABduction 4/5    Right Shoulder Internal Rotation 4/5    Right Shoulder External Rotation 4-/5    Left Shoulder Flexion 4/5    Left Shoulder Internal Rotation 4/5    Left Shoulder External Rotation 4-/5    Right/Left hand Right;Left    Right Hand Grip (lbs) 15   15, 15, 15   Left Hand Grip (lbs) 18   20, 16, 18   Right/Left Hip Right;Left    Right Hip Flexion 4/5    Right Hip Extension 3+/5   limited ROM   Right Hip External Rotation  4/5    Right Hip Internal Rotation 4/5    Right Hip ABduction 4-/5    Right Hip ADduction 4-/5    Left Hip Flexion 4/5    Left Hip  Extension 3+/5   limited ROM   Left Hip External Rotation 4/5    Left Hip Internal Rotation 4-/5    Left Hip ABduction 4-/5    Left Hip ADduction 4-/5    Right/Left Knee Right;Left    Right Knee Flexion 4/5    Right Knee Extension 4+/5    Left Knee Flexion 4/5    Left Knee Extension 4+/5    Right/Left Ankle Right;Left    Right Ankle Dorsiflexion 4+/5    Left Ankle Dorsiflexion 4+/5      Flexibility   Soft Tissue Assessment /Muscle Length yes    Hamstrings mild tight B    Quadriceps mod tight quads & hip flexors    Piriformis mod tight R>L    Obturator Internus mod tight L>R      Palpation   Palpation comment TTP with increased muscle tension in B upper shoulders, neck, thoracolumbar paraspinals, glutes and piriformis      Special Tests    Special Tests Lumbar    Lumbar Tests Prone Knee Bend Test;Straight Leg Raise      Prone Knee Bend Test   Findings Positive    Side --  Bilateral     Straight Leg Raise   Findings Positive    Side  --   Bilateral                       Objective measurements completed on examination: See above findings.               PT Education - 07/24/20 1213     Education Details PT eval findings, anticipated POC & initial HEP - Access Code: [email or web address]    Person(s) Educated Patient    Methods Explanation;Demonstration;Verbal cues;Handout    Comprehension Verbalized understanding;Verbal cues required;Returned demonstration;Need further instruction              PT Short Term Goals - 07/24/20 1214       PT SHORT TERM GOAL #1   Title Patient will be independent with initial HEP    Status New    Target Date 08/14/20      PT SHORT TERM GOAL #2   Title Patient will verbalize/demonstrate understanding of neutral spine posture and proper body mechanics to reduce strain on cervical and lumbar spine    Status New    Target Date 08/21/20               PT Long Term Goals - 07/24/20 1215       PT LONG TERM GOAL  #1   Title Patient will be independent with ongoing/advanced HEP for self-management at home    Status New    Target Date 09/18/20      PT LONG TERM GOAL #2   Title Patient to demonstrate ability to achieve and maintain good spinal alignment/posturing    Status New    Target Date 09/18/20      PT LONG TERM GOAL #3   Title Decrease pain by >/= 50% allowing patient improed work tolerance    Status New    Target Date 09/18/20      PT LONG TERM GOAL #4   Title Patient to improve cerivcal and lumbar AROM to [facility] without pain provocation    Status New    Target Date 09/18/20      PT LONG TERM GOAL #5   Title Patient will demonstrate improved B UE/LE strength to >/= 4 to 4+/5 for improved stability and ease of mobility    Status New    Target Date 09/18/20      PT LONG TERM GOAL #6   Title Patient will report no missed work shifts due to increased pain in >/= 2 weeks    Status New    Target Date 09/18/20                    Plan - 07/24/20 1213     Clinical Impression Statement Anyelin is a 60 y/o female who presents to OP PT for chronic cervical and lumbar radiculopathy for ~2 yrs duration. 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 Pain has been so bad recently that she missed 3 out of 4 work shifts last week. Current deficits include abnormal posture; cervical radiculopathy with more stiffness than pain in her neck, LBP with B LE radiculopathy; increased muscle tension and TTP throughout B upper shoulders, neck, thoracolumbar paraspinals, glutes and piriformis; decreased cervical, shoulder and lumbar AROM; limited proximal LE flexibility; and B grip and proximal  LE weakness. Lilee will benefit from skilled PT to address above deficits to reduce myofascial pain and tightness and improve core and postural strength to restore normal mobility and allow for improved work  tolerance.    Personal Factors and Comorbidities Comorbidity 3+;Age;Past/Current Experience;Social Background;Time since onset of injury/illness/exacerbation    Comorbidities Asthma, DOE, aortic stenosis, GERD, anxiety, depression, polyarthralgia, muscle cramps, stress incontinence    Examination-Activity Limitations [place];Caring for Others;Carry;Lift;Locomotion Level;Reach Overhead;Sit;Sleep;Squat;Stand;Transfers    Examination-Participation Restrictions Cleaning;Community Activity;Driving;Laundry;Meal Prep;Occupation;Shop    Stability/Clinical Decision Making Evolving/Moderate complexity    Clinical Decision Making Moderate    Rehab Potential Good    PT Frequency 2x / week   1-2x/wk - pt wishing to try 1x/wk to start   PT Duration 8 weeks    PT Treatment/Interventions ADLs/Self Care Home Management;Cryotherapy;Electrical Stimulation;Iontophoresis 31m/ml Dexamethasone;Moist Heat;Traction;Ultrasound;Functional mobility training;Therapeutic activities;Therapeutic exercise;Neuromuscular re-education;Patient/family education;Manual techniques;Passive range of motion;Dry needling;Taping;Spinal Manipulations;Joint Manipulations    PT Next Visit Plan Review initial HEP, cervical & lumbopelvic flexibility, core and postural strengthening, manual therapy and modalities PRN    PT Home Exercise Plan Access Code: [email or web address](6/27)    Consulted and Agree with Plan of Care Patient             Patient will benefit from skilled therapeutic intervention in order to improve the following deficits and impairments:  Decreased activity tolerance, Decreased endurance, Decreased knowledge of precautions, Decreased mobility, Decreased range of motion, Decreased safety awareness, Decreased strength, Difficulty walking, Increased fascial restricitons, Increased muscle spasms, Impaired perceived functional ability, Impaired flexibility, Impaired sensation, Impaired UE functional use, Improper body mechanics, Postural  dysfunction, Pain  Visit Diagnosis: Radiculopathy, cervical region  Radiculopathy, lumbar region  Muscle weakness (generalized)  Abnormal posture     Problem List Patient Active Problem List   Diagnosis Date Noted   Thrombocytopenia ([place] 06/21/2020   GERD (gastroesophageal reflux disease)    Asthma    Depression    Anxiety    Allergy    Medication side effect 10/20/2019   Need for 23-polyvalent pneumococcal polysaccharide vaccine 10/20/2019   Need for influenza vaccination 10/20/2019   Aortic stenosis mild, based on echo from 2021 10/01/2019   DOE (dyspnea on exertion) 08/04/2019   Heart murmur 07/05/2019   Depression with anxiety 06/02/2019   Seasonal allergic rhinitis due to pollen 06/02/2019   Muscle cramps 06/02/2019   Vitamin D deficiency 05/22/2019   Polyarthralgia 05/17/2019   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[place] 05/17/2019   Healthcare maintenance 05/17/2019   VENEREAL WART 11/03/2006   CARCINOMA, VULVA 11/03/2006   Class 3 severe obesity due to excess calories with body mass index (BMI) of 40.0 to 44.9 in adult ([facility] 11/03/2006   Tobacco use 11/03/2006   ALLERGIC RHINITIS 11/03/2006   PUD 11/03/2006   DYSPEPSIA 11/03/2006    JPercival Spanish PT, MPT 07/24/2020, 12:53 PM  [facility] [address] [place] [address]Phone: [phone]  Fax:  [phone] Name: TLLOYD CULLINANMRN: [id number] of Birth: [birth year]  PHYSICAL THERAPY DISCHARGE SUMMARY  Visits from Start of Care: 1  Current functional level related to goals / functional outcomes:   Refer to above clinical impression for status as of eval on 07/24/2020. Patient failed to return to PT in >30 days, therefore will proceed with discharge from PT for this episode.   Remaining deficits:   As above. Unable to reassess as pt failed to return to PT.   Education / Equipment:  Initial HEP   Patient agrees to discharge. Patient goals  were not met. Patient is being discharged due to not returning since the last visit.   Percival Spanish, PT, MPT 10/27/20, 9:58 AM  [facility] [address]  [place], [place], [address] Phone: [phone]   Fax:  [phone]

## 2020-07-26 ENCOUNTER — Encounter: Payer: Self-pay | Admitting: Family Medicine

## 2020-07-26 ENCOUNTER — Ambulatory Visit (INDEPENDENT_AMBULATORY_CARE_PROVIDER_SITE_OTHER): Payer: 59 | Admitting: Family Medicine

## 2020-07-26 ENCOUNTER — Other Ambulatory Visit: Payer: Self-pay

## 2020-07-26 VITALS — Ht 64.0 in | Wt 260.0 lb

## 2020-07-26 DIAGNOSIS — M5416 Radiculopathy, lumbar region: Secondary | ICD-10-CM

## 2020-07-26 DIAGNOSIS — M5010 Cervical disc disorder with radiculopathy, unspecified cervical region: Secondary | ICD-10-CM

## 2020-07-26 NOTE — Progress Notes (Signed)
   PCP: Libby Maw, MD  Subjective:   HPI: Patient is a 60 y.o. female here for follow-up on right arm and right back radicular pain.  She was seen here on 06/28/2020, at that time was having radicular symptoms in both the upper and lower extremities, primarily on the right.  X-rays were obtained and showed degenerative disc disease in the cervical and lumbar spine, along with grade 2 anterolisthesis at L5-S1 and grade 1 anterolisthesis at L4-L5.  We decided to trial physical therapy and also increase her duloxetine to see if this would help control her symptoms.  Today, patient states that she has had some very mild improvement in symptoms, she is only able to go to 1 physical therapy appointment so far but has been doing home exercises.  She thinks the duloxetine may be helping slightly.  No new concerns.  She presents today with FMLA paperwork.   Review of Systems:  Per HPI.   [place], medications and smoking status reviewed.      Objective:  Physical Exam:  No flowsheet data found.   Gen: awake, alert, NAD, comfortable in exam room Pulm: breathing unlabored  Cervical spine/upper extremities: -Inspection: Mild loss of lordosis.  No erythema, ecchymoses or edema.  She does have some mild swelling at the MCP joints of the right hand. -Palpation: Mild tenderness to palpation diffusely in the paraspinal musculature.  Muscle tension in the right greater than left trapezius musculature.  No abnormalities on palpation of the right upper extremity. -ROM: Full range of motion of the cervical spine without significant pain.  Forage motion of the shoulder elbow and wrist without significant pain. -Strength: 5/5 strength in bilateral upper extremities and in cervical spine. -Special: Mildly positive Spurling's with reproduction of right-sided shooting pains.  Negative Tinel's at the elbow and median nerve of the wrist   Lumbar spine: Inspection: No evidence of erythema, ecchymosis,  swelling edema. Palpation: No midline spinal tenderness. Nontender to facets. No paraspinal tenderness of the lumbar spines. Nontender to SI joints. ROM: Intact to forward flexion, extension, rotation, and bending.  Reproduction of right-sided leg pain with forward bending. Special tests: Positive straight leg raise on the right   Assessment & Plan:  1.  Right cervical radiculopathy 2.  Right lumbar radiculopathy 3.  Diffuse cervical and lumbar degenerative disc disease 4.  Lumbar degenerative anterolisthesis at L4-L5 and L5-S1  Patient with some mild improvement with conservative therapy, we discussed options and she like to proceed with this for now.  We did discuss that we could have her referred to neurosurgery for consideration of ESI or surgical intervention.  She understands.  Plan: -Continue PT -Increase duloxetine to 90 mg daily -FMLA paperwork filled out today -Follow-up 2 to 3 months for reevaluation   Dagoberto Ligas, MD [facility] Fellow 07/26/2020 1:48 PM  Addendum:  I was the preceptor for this visit and available for immediate consultation.  Karlton Lemon MD Kirt Boys

## 2020-09-30 ENCOUNTER — Other Ambulatory Visit: Payer: Self-pay | Admitting: Gastroenterology

## 2020-10-13 ENCOUNTER — Other Ambulatory Visit: Payer: Self-pay | Admitting: Family Medicine

## 2020-10-13 DIAGNOSIS — M353 Polymyalgia rheumatica: Secondary | ICD-10-CM

## 2020-10-13 DIAGNOSIS — F418 Other specified anxiety disorders: Secondary | ICD-10-CM

## 2020-10-13 DIAGNOSIS — M2550 Pain in unspecified joint: Secondary | ICD-10-CM

## 2020-10-16 ENCOUNTER — Other Ambulatory Visit: Payer: Self-pay

## 2020-10-16 ENCOUNTER — Ambulatory Visit (INDEPENDENT_AMBULATORY_CARE_PROVIDER_SITE_OTHER): Payer: 59 | Admitting: Family Medicine

## 2020-10-16 ENCOUNTER — Encounter: Payer: Self-pay | Admitting: Family Medicine

## 2020-10-16 VITALS — BP 136/76 | HR 94 | Temp 96.8°F | Ht 64.0 in | Wt 272.8 lb

## 2020-10-16 DIAGNOSIS — M2550 Pain in unspecified joint: Secondary | ICD-10-CM | POA: Diagnosis not present

## 2020-10-16 DIAGNOSIS — Z720 Tobacco use: Secondary | ICD-10-CM

## 2020-10-16 DIAGNOSIS — F418 Other specified anxiety disorders: Secondary | ICD-10-CM

## 2020-10-16 DIAGNOSIS — Z23 Encounter for immunization: Secondary | ICD-10-CM | POA: Diagnosis not present

## 2020-10-16 DIAGNOSIS — R252 Cramp and spasm: Secondary | ICD-10-CM

## 2020-10-16 DIAGNOSIS — J309 Allergic rhinitis, unspecified: Secondary | ICD-10-CM | POA: Diagnosis not present

## 2020-10-16 MED ORDER — PREDNISONE 10 MG (21) PO TBPK: ORAL_TABLET | ORAL | 0 refills | Status: DC

## 2020-10-16 MED ORDER — DULOXETINE HCL 60 MG PO CPEP: 60.0000 mg | ORAL_CAPSULE | Freq: Two times a day (BID) | ORAL | 1 refills | Status: DC

## 2020-10-16 MED ORDER — METHOCARBAMOL 500 MG PO TABS: 500.0000 mg | ORAL_TABLET | Freq: Three times a day (TID) | ORAL | 1 refills | Status: DC | PRN

## 2020-10-16 NOTE — Progress Notes (Signed)
Established Patient Office Visit  Subjective:  Patient ID: Sandra Stafford, female    DOB: 1960-03-06  Age: 60 y.o. MRN: [id number]  CC:  Chief Complaint  Patient presents with   Follow-up    Refill/follow up on medications per patient Cymbalta not working after increase.     HPI Sandra Stafford presents for follow-up of depression with possibly myalgias and arthralgias.  Last pends sports medicine visit in July Cymbalta was increased to 60 mg daily.  Patient tells me that it was increased to 90 which she has been taking.  She remains depressed and anxious.  She was directed to schedule follow-up visit with sports medicine.  Needs a refill for Robaxin.  She has been having a frontal headache associated with nasal congestion postnasal drip itchy watery eyes, sneezing.  Denies fevers chills, rhinorrhea or difficulty breathing.  She has been taking an over-the-counter allergy medicine and assures me that she has been using her Flonase daily.  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   Qualifier: Diagnosis of  By: Enrique Sack     Class 3 severe obesity due to excess calories with body mass index (BMI) of 40.0 to 44.9 in adult ([place]) 11/03/2006   Qualifier: Diagnosis of  By: Enrique Sack     Depression    Depression with anxiety 06/02/2019   DOE (dyspnea on exertion) 08/04/2019   DYSPEPSIA 11/03/2006   Qualifier: History of  By: Enrique Sack     Fibroid    Genital warts    GERD (gastroesophageal reflux disease)    Healthcare maintenance 05/17/2019   Heart murmur 07/05/2019   Medication side effect 10/20/2019   Muscle cramps 06/02/2019   Need for 23-polyvalent pneumococcal polysaccharide vaccine 10/20/2019   Need for influenza vaccination  10/20/2019   Polyarthralgia 05/17/2019   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[place]) 05/17/2019   PUD 11/03/2006   Qualifier: Diagnosis of  By: Enrique Sack     Seasonal allergic rhinitis due to pollen 06/02/2019   STD (sexually transmitted disease)    hpv (genital warts   Stress incontinence 11/22/2019   Tobacco use 11/03/2006   Qualifier: Diagnosis of  By: Enrique Sack     VENEREAL WART 11/03/2006   Qualifier: Diagnosis of  By: Enrique Sack     Vitamin D deficiency 05/22/2019    Past Surgical History:  Procedure Laterality Date   ABDOMINAL HYSTERECTOMY     CERVIX LESION DESTRUCTION     CESAREAN SECTION     SALPINGECTOMY      Family History  Problem Relation Age of Onset   Cancer Mother    Diabetes Mother    Hypertension Mother    Diabetes Father    Colon cancer Cousin     Social History   Socioeconomic History   Marital status: Single    Spouse name: Not on file   Number of children: Not on file   Years of education: Not on file   Highest education level: Not on file  Occupational History   Not on file  Tobacco Use   Smoking status: Every Day    Types: Cigarettes   Smokeless tobacco: Never  Vaping Use   Vaping Use: Never used  Substance and Sexual Activity   Alcohol use: Not Currently   Drug use: No   Sexual activity: Not Currently    Birth control/protection: Surgical    Comment: hysterectomy  Other Topics Concern   Not on file  Social History Narrative   Not on file   Social Determinants of Health   Financial Resource Strain: Not on file  Food Insecurity: Not on file  Transportation Needs: Not on file  Physical Activity: Not on file  Stress: Not on file  Social Connections: Not on file  Intimate Partner Violence: Not on file    Outpatient Medications Prior to Visit  Medication Sig Dispense Refill   Acetaminophen (TYLENOL ARTHRITIS PAIN PO) Take by mouth.     albuterol (VENTOLIN HFA) 108 (90 Base) MCG/ACT inhaler SMARTSIG:2 Puff(s) By Mouth Every 6 Hours  PRN     Cetirizine HCl 10 MG CAPS Take 1 capsule by mouth as needed.     pantoprazole (PROTONIX) 20 MG tablet Take 1 tablet by mouth once daily 90 tablet 0   DULoxetine (CYMBALTA) 30 MG capsule Take 1 capsule by mouth once daily 90 capsule 0   methocarbamol (ROBAXIN) 500 MG tablet Take 1 tablet (500 mg total) by mouth every 8 (eight) hours as needed for muscle spasms. 60 tablet 1   No facility-administered medications prior to visit.    Allergies  Allergen Reactions   Dust Mite Mixed Allergen Ext [Mite (D. Farinae)]    Naproxen Other (See Comments)    Abd pain    Pollen Extract     ROS Review of Systems  Constitutional:  Negative for chills, diaphoresis, fatigue, fever and unexpected weight change.  HENT:  Positive for congestion, postnasal drip and sneezing.   Eyes:  Positive for itching.  Respiratory:  Positive for cough. Negative for shortness of breath and wheezing.   Cardiovascular: Negative.   Gastrointestinal: Negative.   Endocrine: Negative for polyphagia and polyuria.  Genitourinary: Negative.   Musculoskeletal:  Positive for arthralgias and myalgias.  Neurological:  Positive for headaches. Negative for speech difficulty and weakness.  Psychiatric/Behavioral:  Positive for dysphoric mood. The patient is nervous/anxious.      Depression screen [facility] 2/9 10/16/2020 06/16/2020 11/22/2019  Decreased Interest 0 2 2  Down, Depressed, Hopeless 0 2 1  PHQ - 2 Score 0 4 3  Altered sleeping - 3 2  Tired, decreased energy - 2 2  Change in appetite - 2 2  Feeling bad or failure about yourself  - 2 2  Trouble concentrating - 0 2  Moving slowly or fidgety/restless - 0 1  Suicidal thoughts - 0 0  PHQ-9 Score - 13 14  Difficult doing work/chores - Somewhat difficult Very difficult     Objective:    Physical Exam Vitals and nursing note reviewed.  Constitutional:      Appearance: Normal appearance. She is obese.  HENT:     Head: Normocephalic and atraumatic.     Right Ear:  Tympanic membrane, ear canal and external ear normal.     Left Ear: Tympanic membrane, ear canal and external ear normal.     Mouth/Throat:     Mouth: Mucous membranes are moist.     Pharynx: Oropharynx is clear. No oropharyngeal exudate or posterior oropharyngeal erythema.  Eyes:     Extraocular Movements: Extraocular movements intact.     Conjunctiva/sclera: Conjunctivae normal.     Pupils: Pupils are equal, round, and reactive to light.  Neck:     Vascular:  No carotid bruit.  Cardiovascular:     Rate and Rhythm: Normal rate and regular rhythm.  Pulmonary:     Effort: Pulmonary effort is normal. No respiratory distress.     Breath sounds: Normal breath sounds. No stridor. No wheezing, rhonchi or rales.  Abdominal:     General: Bowel sounds are normal.  Musculoskeletal:     Cervical back: No rigidity or tenderness.  Lymphadenopathy:     Cervical: No cervical adenopathy.  Skin:    General: Skin is warm and dry.  Neurological:     Mental Status: She is alert and oriented to person, place, and time.  Psychiatric:        Mood and Affect: Mood normal.        Behavior: Behavior normal.    BP 136/76 (BP Location: Left Arm, Patient Position: Sitting, Cuff Size: Large)   Pulse 94   Temp (!) 96.8 F (36 C) (Temporal)   Ht 5\' 4"  (1.626 m)   Wt 272 lb 12.8 oz (123.7 kg)   SpO2 95%   BMI 46.83 kg/m  Wt Readings from Last 3 Encounters:  10/16/20 272 lb 12.8 oz (123.7 kg)  07/26/20 260 lb (117.9 kg)  07/18/20 265 lb (120.2 kg)     Health Maintenance Due  Topic Date Due   TETANUS/TDAP  Never done   Zoster Vaccines- Shingrix (1 of 2) Never done    There are no preventive care reminders to display for this patient.  Lab Results  Component Value Date   TSH 0.83 06/29/2020   Lab Results  Component Value Date   WBC 6.5 06/20/2020   HGB 14.4 06/20/2020   HCT 43.8 06/20/2020   MCV 89.7 06/20/2020   PLT 87.0 (L) 06/20/2020   Lab Results  Component Value Date   NA 141  06/29/2020   K 4.1 06/29/2020   CO2 23 06/29/2020   GLUCOSE 95 06/29/2020   BUN 9 06/29/2020   CREATININE 0.65 06/29/2020   BILITOT 0.3 06/29/2020   ALKPHOS 49 06/29/2020   AST 15 06/29/2020   ALT 14 06/29/2020   PROT 5.7 (L) 06/29/2020   ALBUMIN 3.7 06/29/2020   CALCIUM 8.7 06/29/2020   GFR 95.70 06/29/2020   Lab Results  Component Value Date   CHOL 188 06/29/2020   Lab Results  Component Value Date   HDL 60.60 06/29/2020   Lab Results  Component Value Date   LDLCALC 98 06/29/2020   Lab Results  Component Value Date   TRIG 143.0 06/29/2020   Lab Results  Component Value Date   CHOLHDL 3 06/29/2020   No results found for: HGBA1C    Assessment & Plan:   Problem List Items Addressed This Visit       Respiratory   Allergic rhinitis   Relevant Medications   predniSONE (STERAPRED UNI-PAK 21 TAB) 10 MG (21) TBPK tablet     Other   Tobacco use   Relevant Orders   Ambulatory referral to Smoking Cessation   Polyarthralgia   Relevant Medications   DULoxetine (CYMBALTA) 60 MG capsule   Depression with anxiety   Relevant Medications   DULoxetine (CYMBALTA) 60 MG capsule   Muscle cramps   Relevant Medications   methocarbamol (ROBAXIN) 500 MG tablet   Need for influenza vaccination - Primary   Relevant Orders   Flu Vaccine QUAD 6+ mos PF IM (Fluarix Quad PF) (Completed)    Meds ordered this encounter  Medications   DULoxetine (CYMBALTA) 60 MG capsule  Sig: Take 1 capsule (60 mg total) by mouth 2 (two) times daily.    Dispense:  180 capsule    Refill:  1   predniSONE (STERAPRED UNI-PAK 21 TAB) 10 MG (21) TBPK tablet    Sig: Take 6 today, 5 tomorrow, 4 the next day and then 3, 2, 1 and stop    Dispense:  21 tablet    Refill:  0   methocarbamol (ROBAXIN) 500 MG tablet    Sig: Take 1 tablet (500 mg total) by mouth every 8 (eight) hours as needed for muscle spasms.    Dispense:  60 tablet    Refill:  1    Follow-up: Return in about 3 months (around  01/15/2021), or Increased Cymbalta to 120mg . Follow up with Sports Med..  Continue Flonase daily and allergy tablet.  Short course of prednisone.  Schedule follow-up with sports medicine.  Libby Maw, MD

## 2020-10-26 ENCOUNTER — Other Ambulatory Visit: Payer: Self-pay

## 2020-10-26 ENCOUNTER — Other Ambulatory Visit: Payer: Self-pay | Admitting: Family Medicine

## 2020-10-26 ENCOUNTER — Encounter: Payer: Self-pay | Admitting: Family Medicine

## 2020-10-26 ENCOUNTER — Ambulatory Visit (INDEPENDENT_AMBULATORY_CARE_PROVIDER_SITE_OTHER): Payer: 59 | Admitting: Family Medicine

## 2020-10-26 VITALS — BP 124/76 | HR 114 | Temp 96.3°F | Ht 64.0 in | Wt 273.6 lb

## 2020-10-26 DIAGNOSIS — B349 Viral infection, unspecified: Secondary | ICD-10-CM | POA: Diagnosis not present

## 2020-10-26 DIAGNOSIS — H6993 Unspecified Eustachian tube disorder, bilateral: Secondary | ICD-10-CM

## 2020-10-26 DIAGNOSIS — H6983 Other specified disorders of Eustachian tube, bilateral: Secondary | ICD-10-CM | POA: Diagnosis not present

## 2020-10-26 DIAGNOSIS — J4521 Mild intermittent asthma with (acute) exacerbation: Secondary | ICD-10-CM

## 2020-10-26 DIAGNOSIS — H6981 Other specified disorders of Eustachian tube, right ear: Secondary | ICD-10-CM | POA: Insufficient documentation

## 2020-10-26 DIAGNOSIS — H6991 Unspecified Eustachian tube disorder, right ear: Secondary | ICD-10-CM | POA: Insufficient documentation

## 2020-10-26 DIAGNOSIS — J301 Allergic rhinitis due to pollen: Secondary | ICD-10-CM

## 2020-10-26 MED ORDER — BENZONATATE 200 MG PO CAPS: 200.0000 mg | ORAL_CAPSULE | Freq: Two times a day (BID) | ORAL | 0 refills | Status: DC | PRN

## 2020-10-26 MED ORDER — PREDNISONE 20 MG PO TABS: 20.0000 mg | ORAL_TABLET | Freq: Two times a day (BID) | ORAL | 0 refills | Status: DC

## 2020-10-26 MED ORDER — ALBUTEROL SULFATE HFA 108 (90 BASE) MCG/ACT IN AERS: 1.0000 | INHALATION_SPRAY | Freq: Four times a day (QID) | RESPIRATORY_TRACT | 2 refills | Status: DC | PRN

## 2020-10-26 NOTE — Progress Notes (Signed)
Established Patient Office Visit  Subjective:  Patient ID: Sandra Stafford, female    DOB: 18-Aug-1960  Age: 60 y.o. MRN: [id number]  CC:  Chief Complaint  Patient presents with   Cough    Cough, headaches, ear pain, SOB and body aches symptoms x 3 days.     HPI Sandra Stafford presents for evaluation and treatment for 3-day history of malaise, nasal congestion watery rhinorrhea postnasal drip cough with wheezing and diarrhea.  Denies sore throat, fever chills, myalgias arthralgias.  History of reactive airway disease.  Complains of ear discomfort and congestion.  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   Qualifier: Diagnosis of  By: Enrique Sack     Class 3 severe obesity due to excess calories with body mass index (BMI) of 40.0 to 44.9 in adult ([place]) 11/03/2006   Qualifier: Diagnosis of  By: Enrique Sack     Depression    Depression with anxiety 06/02/2019   DOE (dyspnea on exertion) 08/04/2019   DYSPEPSIA 11/03/2006   Qualifier: History of  By: Enrique Sack     Fibroid    Genital warts    GERD (gastroesophageal reflux disease)    Healthcare maintenance 05/17/2019   Heart murmur 07/05/2019   Medication side effect 10/20/2019   Muscle cramps 06/02/2019   Need for 23-polyvalent pneumococcal polysaccharide vaccine 10/20/2019   Need for influenza vaccination 10/20/2019   Polyarthralgia 05/17/2019   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[place]) 05/17/2019   PUD 11/03/2006   Qualifier: Diagnosis of  By: Enrique Sack     Seasonal allergic rhinitis due to pollen 06/02/2019   STD (sexually transmitted disease)    hpv (genital warts   Stress incontinence 11/22/2019   Tobacco use 11/03/2006   Qualifier: Diagnosis of  By: Enrique Sack     VENEREAL WART  11/03/2006   Qualifier: Diagnosis of  By: Enrique Sack     Vitamin D deficiency 05/22/2019    Past Surgical History:  Procedure Laterality Date   ABDOMINAL HYSTERECTOMY     CERVIX LESION DESTRUCTION     CESAREAN SECTION     SALPINGECTOMY      Family History  Problem Relation Age of Onset   Cancer Mother    Diabetes Mother    Hypertension Mother    Diabetes Father    Colon cancer Cousin     Social History   Socioeconomic History   Marital status: Single    Spouse name: Not on file   Number of children: Not on file   Years of education: Not on file   Highest education level: Not on file  Occupational History   Not on file  Tobacco Use   Smoking status: Every Day    Types: Cigarettes   Smokeless tobacco: Never  Vaping Use   Vaping Use: Never used  Substance and Sexual Activity   Alcohol use: Not Currently   Drug use: No   Sexual activity: Not Currently    Birth control/protection: Surgical    Comment: hysterectomy  Other Topics Concern   Not on file  Social History Narrative   Not on file   Social Determinants of Health   Financial Resource Strain: Not on file  Food  Insecurity: Not on file  Transportation Needs: Not on file  Physical Activity: Not on file  Stress: Not on file  Social Connections: Not on file  Intimate Partner Violence: Not on file    Outpatient Medications Prior to Visit  Medication Sig Dispense Refill   Acetaminophen (TYLENOL ARTHRITIS PAIN PO) Take by mouth.     Cetirizine HCl 10 MG CAPS Take 1 capsule by mouth as needed.     DULoxetine (CYMBALTA) 60 MG capsule Take 1 capsule (60 mg total) by mouth 2 (two) times daily. 180 capsule 1   fluticasone (FLONASE) 50 MCG/ACT nasal spray Use 2 spray(s) in each nostril once daily 16 g 0   methocarbamol (ROBAXIN) 500 MG tablet Take 1 tablet (500 mg total) by mouth every 8 (eight) hours as needed for muscle spasms. 60 tablet 1   pantoprazole (PROTONIX) 20 MG tablet Take 1 tablet by mouth once  daily 90 tablet 0   albuterol (VENTOLIN HFA) 108 (90 Base) MCG/ACT inhaler SMARTSIG:2 Puff(s) By Mouth Every 6 Hours PRN     predniSONE (STERAPRED UNI-PAK 21 TAB) 10 MG (21) TBPK tablet Take 6 today, 5 tomorrow, 4 the next day and then 3, 2, 1 and stop 21 tablet 0   No facility-administered medications prior to visit.    Allergies  Allergen Reactions   Dust Mite Mixed Allergen Ext [Mite (D. Farinae)]    Naproxen Other (See Comments)    Abd pain    Pollen Extract     ROS Review of Systems  Constitutional:  Positive for fatigue. Negative for diaphoresis, fever and unexpected weight change.  HENT:  Positive for congestion, facial swelling, postnasal drip and rhinorrhea. Negative for sore throat, trouble swallowing and voice change.   Eyes:  Negative for photophobia and visual disturbance.  Respiratory:  Positive for cough and wheezing.   Gastrointestinal:  Positive for diarrhea.  Musculoskeletal:  Negative for arthralgias and myalgias.  Skin: Negative.   Neurological:  Positive for headaches.     Objective:    Physical Exam Vitals and nursing note reviewed.  Constitutional:      General: She is not in acute distress.    Appearance: Normal appearance. She is not ill-appearing, toxic-appearing or diaphoretic.  HENT:     Head: Normocephalic and atraumatic.     Right Ear: Tympanic membrane and ear canal normal.     Left Ear: Tympanic membrane and ear canal normal.     Mouth/Throat:     Mouth: Mucous membranes are moist.     Pharynx: Oropharynx is clear. No oropharyngeal exudate or posterior oropharyngeal erythema.  Eyes:     General: No scleral icterus.       Right eye: No discharge.        Left eye: No discharge.     Extraocular Movements: Extraocular movements intact.     Conjunctiva/sclera: Conjunctivae normal.     Pupils: Pupils are equal, round, and reactive to light.  Cardiovascular:     Rate and Rhythm: Regular rhythm.  Pulmonary:     Effort: Pulmonary effort is  normal. No respiratory distress.     Breath sounds: Normal breath sounds. No stridor. No wheezing, rhonchi or rales.  Abdominal:     General: Bowel sounds are normal.  Musculoskeletal:     Cervical back: No rigidity or tenderness.     Right lower leg: No edema.     Left lower leg: No edema.  Lymphadenopathy:     Cervical: No cervical adenopathy.  Skin:  General: Skin is warm and dry.  Neurological:     Mental Status: She is alert and oriented to person, place, and time.  Psychiatric:        Mood and Affect: Mood normal.        Behavior: Behavior normal.    BP 124/76 (BP Location: Left Arm, Patient Position: Sitting, Cuff Size: Large)   Pulse (!) 114   Temp (!) 96.3 F (35.7 C) (Temporal)   Ht 5\' 4"  (1.626 m)   Wt 273 lb 9.6 oz (124.1 kg)   SpO2 94%   BMI 46.96 kg/m  Wt Readings from Last 3 Encounters:  10/26/20 273 lb 9.6 oz (124.1 kg)  10/16/20 272 lb 12.8 oz (123.7 kg)  07/26/20 260 lb (117.9 kg)     Health Maintenance Due  Topic Date Due   TETANUS/TDAP  Never done   Zoster Vaccines- Shingrix (1 of 2) Never done    There are no preventive care reminders to display for this patient.  Lab Results  Component Value Date   TSH 0.83 06/29/2020   Lab Results  Component Value Date   WBC 6.5 06/20/2020   HGB 14.4 06/20/2020   HCT 43.8 06/20/2020   MCV 89.7 06/20/2020   PLT 87.0 (L) 06/20/2020   Lab Results  Component Value Date   NA 141 06/29/2020   K 4.1 06/29/2020   CO2 23 06/29/2020   GLUCOSE 95 06/29/2020   BUN 9 06/29/2020   CREATININE 0.65 06/29/2020   BILITOT 0.3 06/29/2020   ALKPHOS 49 06/29/2020   AST 15 06/29/2020   ALT 14 06/29/2020   PROT 5.7 (L) 06/29/2020   ALBUMIN 3.7 06/29/2020   CALCIUM 8.7 06/29/2020   GFR 95.70 06/29/2020   Lab Results  Component Value Date   CHOL 188 06/29/2020   Lab Results  Component Value Date   HDL 60.60 06/29/2020   Lab Results  Component Value Date   LDLCALC 98 06/29/2020   Lab Results   Component Value Date   TRIG 143.0 06/29/2020   Lab Results  Component Value Date   CHOLHDL 3 06/29/2020   No results found for: HGBA1C    Assessment & Plan:   Problem List Items Addressed This Visit       Respiratory   Reactive airway disease   Relevant Medications   predniSONE (DELTASONE) 20 MG tablet   albuterol (VENTOLIN HFA) 108 (90 Base) MCG/ACT inhaler     Nervous and Auditory   Dysfunction of both eustachian tubes   Relevant Medications   predniSONE (DELTASONE) 20 MG tablet     Other   Viral syndrome - Primary   Relevant Medications   albuterol (VENTOLIN HFA) 108 (90 Base) MCG/ACT inhaler   benzonatate (TESSALON) 200 MG capsule   Other Relevant Orders   COVID-19, Flu A+B and RSV    Meds ordered this encounter  Medications   predniSONE (DELTASONE) 20 MG tablet    Sig: Take 1 tablet (20 mg total) by mouth 2 (two) times daily with a meal for 7 days.    Dispense:  14 tablet    Refill:  0   albuterol (VENTOLIN HFA) 108 (90 Base) MCG/ACT inhaler    Sig: Inhale 1-2 puffs into the lungs every 6 (six) hours as needed for wheezing or shortness of breath. Or cough.    Dispense:  8 g    Refill:  2   benzonatate (TESSALON) 200 MG capsule    Sig: Take 1 capsule (200 mg  total) by mouth 2 (two) times daily as needed for cough.    Dispense:  20 capsule    Refill:  0    Follow-up: Return follow up Tues/Wed.    Libby Maw, MD

## 2020-10-28 LAB — COVID-19, FLU A+B AND RSV
Influenza A, NAA: NOT DETECTED
Influenza B, NAA: NOT DETECTED
RSV, NAA: NOT DETECTED
SARS-CoV-2, NAA: DETECTED — AB

## 2020-10-31 ENCOUNTER — Encounter: Payer: Self-pay | Admitting: Family Medicine

## 2020-10-31 ENCOUNTER — Telehealth (INDEPENDENT_AMBULATORY_CARE_PROVIDER_SITE_OTHER): Payer: 59 | Admitting: Family Medicine

## 2020-10-31 VITALS — Temp 99.5°F | Ht 64.0 in | Wt 273.0 lb

## 2020-10-31 DIAGNOSIS — J4541 Moderate persistent asthma with (acute) exacerbation: Secondary | ICD-10-CM

## 2020-10-31 MED ORDER — PREDNISONE 10 MG (21) PO TBPK
ORAL_TABLET | ORAL | 0 refills | Status: DC
Start: 2020-10-31 — End: 2020-12-25

## 2020-10-31 NOTE — Progress Notes (Signed)
Established Patient Office Visit  Subjective:  Patient ID: Sandra Stafford, female    DOB: November 22, 1960  Age: 60 y.o. MRN: [id number]  CC:  Chief Complaint  Patient presents with   Covid Positive    Positive for covid still having lots of coughing, SOB, cold chills, low grade temp, headaches.     HPI Sandra Stafford presents for follow-up of her COVID infection.  Continues to experience fatigue, mildly elevated temperature to 99, chills, ear congestion and cough with wheeze.  Denies shortness of breath.  Feels better while taking the prednisone but symptoms seem to worsen just before the second dose.  Overall improving.   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   Qualifier: Diagnosis of  By: Enrique Sack     Class 3 severe obesity due to excess calories with body mass index (BMI) of 40.0 to 44.9 in adult ([place]) 11/03/2006   Qualifier: Diagnosis of  By: Enrique Sack     Depression    Depression with anxiety 06/02/2019   DOE (dyspnea on exertion) 08/04/2019   DYSPEPSIA 11/03/2006   Qualifier: History of  By: Enrique Sack     Fibroid    Genital warts    GERD (gastroesophageal reflux disease)    Healthcare maintenance 05/17/2019   Heart murmur 07/05/2019   Medication side effect 10/20/2019   Muscle cramps 06/02/2019   Need for 23-polyvalent pneumococcal polysaccharide vaccine 10/20/2019   Need for influenza vaccination 10/20/2019   Polyarthralgia 05/17/2019   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[employer]) 05/17/2019   PUD 11/03/2006   Qualifier: Diagnosis of  By: Enrique Sack     Seasonal allergic rhinitis due to pollen 06/02/2019   STD (sexually transmitted disease)    hpv (genital warts   Stress incontinence 11/22/2019   Tobacco use 11/03/2006   Qualifier:  Diagnosis of  By: Enrique Sack     VENEREAL WART 11/03/2006   Qualifier: Diagnosis of  By: Enrique Sack     Vitamin D deficiency 05/22/2019    Past Surgical History:  Procedure Laterality Date   ABDOMINAL HYSTERECTOMY     CERVIX LESION DESTRUCTION     CESAREAN SECTION     SALPINGECTOMY      Family History  Problem Relation Age of Onset   Cancer Mother    Diabetes Mother    Hypertension Mother    Diabetes Father    Colon cancer Cousin     Social History   Socioeconomic History   Marital status: Single    Spouse name: Not on file   Number of children: Not on file   Years of education: Not on file   Highest education level: Not on file  Occupational History   Not on file  Tobacco Use   Smoking status: Every Day    Types: Cigarettes   Smokeless tobacco: Never  Vaping Use   Vaping Use: Never used  Substance and Sexual Activity   Alcohol use: Not Currently   Drug use: No   Sexual activity: Not Currently    Birth control/protection: Surgical    Comment: hysterectomy  Other Topics Concern   Not on file  Social History Narrative   Not on file   Social Determinants  of Health   Financial Resource Strain: Not on file  Food Insecurity: Not on file  Transportation Needs: Not on file  Physical Activity: Not on file  Stress: Not on file  Social Connections: Not on file  Intimate Partner Violence: Not on file    Outpatient Medications Prior to Visit  Medication Sig Dispense Refill   Acetaminophen (TYLENOL ARTHRITIS PAIN PO) Take by mouth.     albuterol (VENTOLIN HFA) 108 (90 Base) MCG/ACT inhaler Inhale 1-2 puffs into the lungs every 6 (six) hours as needed for wheezing or shortness of breath. Or cough. 8 g 2   benzonatate (TESSALON) 200 MG capsule Take 1 capsule (200 mg total) by mouth 2 (two) times daily as needed for cough. 20 capsule 0   Cetirizine HCl 10 MG CAPS Take 1 capsule by mouth as needed.     DULoxetine (CYMBALTA) 60 MG capsule Take 1 capsule (60  mg total) by mouth 2 (two) times daily. 180 capsule 1   fluticasone (FLONASE) 50 MCG/ACT nasal spray Use 2 spray(s) in each nostril once daily 16 g 0   methocarbamol (ROBAXIN) 500 MG tablet Take 1 tablet (500 mg total) by mouth every 8 (eight) hours as needed for muscle spasms. 60 tablet 1   pantoprazole (PROTONIX) 20 MG tablet Take 1 tablet by mouth once daily 90 tablet 0   predniSONE (DELTASONE) 20 MG tablet Take 1 tablet (20 mg total) by mouth 2 (two) times daily with a meal for 7 days. 14 tablet 0   No facility-administered medications prior to visit.    Allergies  Allergen Reactions   Dust Mite Mixed Allergen Ext [Mite (D. Farinae)]    Naproxen Other (See Comments)    Abd pain    Pollen Extract     ROS Review of Systems  Constitutional:  Positive for chills and fatigue.  HENT:  Positive for congestion, hearing loss and postnasal drip. Negative for sore throat.   Respiratory:  Positive for cough and wheezing. Negative for shortness of breath.   Neurological:  Positive for headaches.     Objective:    Physical Exam Vitals and nursing note reviewed.  Pulmonary:     Effort: Pulmonary effort is normal.     Comments: Patient is speaking in complete sentences without difficulty. Neurological:     Mental Status: She is alert and oriented to person, place, and time.  Psychiatric:        Mood and Affect: Mood normal.        Behavior: Behavior normal.    Temp 99.5 F (37.5 C) (Oral)   Ht 5\' 4"  (1.626 m)   Wt 273 lb (123.8 kg)   BMI 46.86 kg/m  Wt Readings from Last 3 Encounters:  10/31/20 273 lb (123.8 kg)  10/26/20 273 lb 9.6 oz (124.1 kg)  10/16/20 272 lb 12.8 oz (123.7 kg)     Health Maintenance Due  Topic Date Due   TETANUS/TDAP  Never done   Zoster Vaccines- Shingrix (1 of 2) Never done    There are no preventive care reminders to display for this patient.  Lab Results  Component Value Date   TSH 0.83 06/29/2020   Lab Results  Component Value Date    WBC 6.5 06/20/2020   HGB 14.4 06/20/2020   HCT 43.8 06/20/2020   MCV 89.7 06/20/2020   PLT 87.0 (L) 06/20/2020   Lab Results  Component Value Date   NA 141 06/29/2020   K 4.1 06/29/2020   CO2  23 06/29/2020   GLUCOSE 95 06/29/2020   BUN 9 06/29/2020   CREATININE 0.65 06/29/2020   BILITOT 0.3 06/29/2020   ALKPHOS 49 06/29/2020   AST 15 06/29/2020   ALT 14 06/29/2020   PROT 5.7 (L) 06/29/2020   ALBUMIN 3.7 06/29/2020   CALCIUM 8.7 06/29/2020   GFR 95.70 06/29/2020   Lab Results  Component Value Date   CHOL 188 06/29/2020   Lab Results  Component Value Date   HDL 60.60 06/29/2020   Lab Results  Component Value Date   LDLCALC 98 06/29/2020   Lab Results  Component Value Date   TRIG 143.0 06/29/2020   Lab Results  Component Value Date   CHOLHDL 3 06/29/2020   No results found for: HGBA1C    Assessment & Plan:   Problem List Items Addressed This Visit       Respiratory   Reactive airway disease - Primary   Relevant Medications   predniSONE (STERAPRED UNI-PAK 21 TAB) 10 MG (21) TBPK tablet    Meds ordered this encounter  Medications   predniSONE (STERAPRED UNI-PAK 21 TAB) 10 MG (21) TBPK tablet    Sig: Take 6 today, 5 tomorrow, 4 the next day and then 3, 2, 1 and stop    Dispense:  21 tablet    Refill:  0    Follow-up: Return in about 1 year (around 10/31/2021), or if symptoms worsen or fail to improve, for Will need to return for office visit in 1 week if not improved..  Back to work on the 10th.  Tapering Dosepak for 6 more days  Libby Maw, MD  Virtual Visit via Telephone Note  I connected with Debbe Odea on 10/31/20 at 11:30 AM EDT by telephone and verified that I am speaking with the correct person using two identifiers.  Location: Patient: Home alone in a room. Provider: work   I discussed the limitations, risks, security and privacy concerns of performing an evaluation and management service by telephone and the availability of  in person appointments. I also discussed with the patient that there may be a patient responsible charge related to this service. The patient expressed understanding and agreed to proceed.   History of Present Illness:    Observations/Objective:   Assessment and Plan:   Follow Up Instructions:    I discussed the assessment and treatment plan with the patient. The patient was provided an opportunity to ask questions and all were answered. The patient agreed with the plan and demonstrated an understanding of the instructions.   The patient was advised to call back or seek an in-person evaluation if the symptoms worsen or if the condition fails to improve as anticipated.  I provided 20 minutes of non-face-to-face time during this encounter.   Libby Maw, MD

## 2020-12-02 ENCOUNTER — Other Ambulatory Visit: Payer: Self-pay | Admitting: Gastroenterology

## 2020-12-11 ENCOUNTER — Telehealth: Payer: Self-pay | Admitting: Gastroenterology

## 2020-12-11 MED ORDER — PANTOPRAZOLE SODIUM 20 MG PO TBEC: 20.0000 mg | DELAYED_RELEASE_TABLET | Freq: Every day | ORAL | 0 refills | Status: DC

## 2020-12-11 NOTE — Telephone Encounter (Signed)
Patient called requesting a refill of Protonix called into the [facility] on [address]. If you need to reach patient, please call [phone].  Thank you.

## 2020-12-11 NOTE — Telephone Encounter (Signed)
The patient was instructed when her last refill was sent to the pharmacy on 10/03/2020 she needed to schedule an appointment to receive more refills.

## 2020-12-25 ENCOUNTER — Encounter: Payer: Self-pay | Admitting: Family Medicine

## 2020-12-25 ENCOUNTER — Ambulatory Visit (INDEPENDENT_AMBULATORY_CARE_PROVIDER_SITE_OTHER): Payer: 59 | Admitting: Family Medicine

## 2020-12-25 ENCOUNTER — Telehealth: Payer: Self-pay | Admitting: Family Medicine

## 2020-12-25 ENCOUNTER — Ambulatory Visit (INDEPENDENT_AMBULATORY_CARE_PROVIDER_SITE_OTHER): Payer: 59

## 2020-12-25 ENCOUNTER — Other Ambulatory Visit: Payer: Self-pay

## 2020-12-25 VITALS — BP 128/70 | HR 96 | Temp 97.2°F | Ht 64.0 in | Wt 282.2 lb

## 2020-12-25 DIAGNOSIS — Z720 Tobacco use: Secondary | ICD-10-CM | POA: Diagnosis not present

## 2020-12-25 DIAGNOSIS — J4541 Moderate persistent asthma with (acute) exacerbation: Secondary | ICD-10-CM

## 2020-12-25 DIAGNOSIS — J22 Unspecified acute lower respiratory infection: Secondary | ICD-10-CM | POA: Diagnosis not present

## 2020-12-25 DIAGNOSIS — J329 Chronic sinusitis, unspecified: Secondary | ICD-10-CM

## 2020-12-25 DIAGNOSIS — Q2579 Other congenital malformations of pulmonary artery: Secondary | ICD-10-CM

## 2020-12-25 MED ORDER — AMOXICILLIN 875 MG PO TABS: 875.0000 mg | ORAL_TABLET | Freq: Two times a day (BID) | ORAL | 0 refills | Status: AC

## 2020-12-25 MED ORDER — PREDNISONE 20 MG PO TABS: 20.0000 mg | ORAL_TABLET | Freq: Two times a day (BID) | ORAL | 0 refills | Status: AC

## 2020-12-25 MED ORDER — BENZONATATE 200 MG PO CAPS
200.0000 mg | ORAL_CAPSULE | Freq: Two times a day (BID) | ORAL | 0 refills | Status: DC | PRN
Start: 2020-12-25 — End: 2021-04-09

## 2020-12-25 NOTE — Progress Notes (Addendum)
Established Patient Office Visit  Subjective:  Patient ID: Sandra Stafford, female    DOB: 04/29/1960  Age: 60 y.o. MRN: [id number]  CC:  Chief Complaint  Patient presents with   Follow-up    3 month follow up concerns about wheezing and cough,ear pain, chest congestion lots of mucus symptoms x 1 week.     HPI Sandra Stafford presents for a 1 week history of cough productive, limegreen to dark phlegm associated with wheezing and tightness in the chest.  Status post temperatures up to 102 last week.  Fevers now resolved.  There has been some minor nose frontal headache.  Ears have been congested as well.  She has cut back on her smoking to one third of a pack per day.  Denies frank history of asthma but was diagnosed with bronchitis as a child as well as allergy induced wheezing.  She has been smoking a pack a day for 40 years.  Cymbalta is helping with depression but she continues to have to take Tylenol arthritis for her various aches and pains.  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   Qualifier: Diagnosis of  By: Enrique Sack     Class 3 severe obesity due to excess calories with body mass index (BMI) of 40.0 to 44.9 in adult ([place]) 11/03/2006   Qualifier: Diagnosis of  By: Enrique Sack     Depression    Depression with anxiety 06/02/2019   DOE (dyspnea on exertion) 08/04/2019   DYSPEPSIA 11/03/2006   Qualifier: History of  By: Enrique Sack     Fibroid    Genital warts    GERD (gastroesophageal reflux disease)    Healthcare maintenance 05/17/2019   Heart murmur 07/05/2019   Medication side effect 10/20/2019   Muscle cramps 06/02/2019   Need for 23-polyvalent pneumococcal polysaccharide vaccine 10/20/2019   Need for influenza vaccination  10/20/2019   Polyarthralgia 05/17/2019   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[place]) 05/17/2019   PUD 11/03/2006   Qualifier: Diagnosis of  By: Enrique Sack     Seasonal allergic rhinitis due to pollen 06/02/2019   STD (sexually transmitted disease)    hpv (genital warts   Stress incontinence 11/22/2019   Tobacco use 11/03/2006   Qualifier: Diagnosis of  By: Enrique Sack     VENEREAL WART 11/03/2006   Qualifier: Diagnosis of  By: Enrique Sack     Vitamin D deficiency 05/22/2019    Past Surgical History:  Procedure Laterality Date   ABDOMINAL HYSTERECTOMY     CERVIX LESION DESTRUCTION     CESAREAN SECTION     SALPINGECTOMY      Family History  Problem Relation Age of Onset   Cancer Mother    Diabetes Mother    Hypertension Mother    Diabetes Father    Colon cancer Cousin     Social History   Socioeconomic History   Marital status: Single    Spouse name: Not on file   Number of children: Not on file   Years of education: Not on file   Highest education level: Not on file  Occupational History   Not on file  Tobacco Use   Smoking status: Every Day  Types: Cigarettes   Smokeless tobacco: Never  Vaping Use   Vaping Use: Never used  Substance and Sexual Activity   Alcohol use: Not Currently   Drug use: No   Sexual activity: Not Currently    Birth control/protection: Surgical    Comment: hysterectomy  Other Topics Concern   Not on file  Social History Narrative   Not on file   Social Determinants of Health   Financial Resource Strain: Not on file  Food Insecurity: Not on file  Transportation Needs: Not on file  Physical Activity: Not on file  Stress: Not on file  Social Connections: Not on file  Intimate Partner Violence: Not on file    Outpatient Medications Prior to Visit  Medication Sig Dispense Refill   Acetaminophen (TYLENOL ARTHRITIS PAIN PO) Take by mouth.     albuterol (VENTOLIN HFA) 108 (90 Base) MCG/ACT inhaler Inhale 1-2 puffs into the lungs every 6  (six) hours as needed for wheezing or shortness of breath. Or cough. 8 g 2   DULoxetine (CYMBALTA) 60 MG capsule Take 1 capsule (60 mg total) by mouth 2 (two) times daily. 180 capsule 1   fluticasone (FLONASE) 50 MCG/ACT nasal spray Use 2 spray(s) in each nostril once daily 16 g 0   methocarbamol (ROBAXIN) 500 MG tablet Take 1 tablet (500 mg total) by mouth every 8 (eight) hours as needed for muscle spasms. 60 tablet 1   Cetirizine HCl 10 MG CAPS Take 1 capsule by mouth as needed. (Patient not taking: Reported on 12/25/2020)     pantoprazole (PROTONIX) 20 MG tablet Take 1 tablet (20 mg total) by mouth daily. 90 tablet 0   benzonatate (TESSALON) 200 MG capsule Take 1 capsule (200 mg total) by mouth 2 (two) times daily as needed for cough. (Patient not taking: Reported on 12/25/2020) 20 capsule 0   predniSONE (STERAPRED UNI-PAK 21 TAB) 10 MG (21) TBPK tablet Take 6 today, 5 tomorrow, 4 the next day and then 3, 2, 1 and stop 21 tablet 0   No facility-administered medications prior to visit.    Allergies  Allergen Reactions   Dust Mite Mixed Allergen Ext [Mite (D. Farinae)]    Naproxen Other (See Comments)    Abd pain    Pollen Extract     ROS Review of Systems  Constitutional:  Negative for chills, diaphoresis, fever and unexpected weight change.  HENT:  Positive for congestion and rhinorrhea.   Eyes:  Negative for photophobia and visual disturbance.  Respiratory:  Positive for cough, chest tightness and wheezing.   Cardiovascular: Negative.   Gastrointestinal:  Negative for diarrhea and vomiting.  Genitourinary: Negative.   Musculoskeletal:  Negative for gait problem and joint swelling.  Skin: Negative.   Neurological:  Negative for speech difficulty and weakness.     Objective:    Physical Exam Vitals and nursing note reviewed.  Constitutional:      General: She is not in acute distress.    Appearance: Normal appearance. She is obese. She is not ill-appearing, toxic-appearing  or diaphoretic.  HENT:     Head: Normocephalic and atraumatic.     Right Ear: Tympanic membrane, ear canal and external ear normal.     Left Ear: Tympanic membrane, ear canal and external ear normal.     Mouth/Throat:     Mouth: Mucous membranes are moist.     Pharynx: Oropharynx is clear. No oropharyngeal exudate or posterior oropharyngeal erythema.  Eyes:     General:  Right eye: No discharge.        Left eye: No discharge.     Extraocular Movements: Extraocular movements intact.     Conjunctiva/sclera: Conjunctivae normal.     Pupils: Pupils are equal, round, and reactive to light.  Cardiovascular:     Rate and Rhythm: Normal rate and regular rhythm.  Pulmonary:     Effort: Pulmonary effort is normal. No respiratory distress.     Breath sounds: No wheezing or rales.  Musculoskeletal:     Cervical back: No rigidity or tenderness.  Lymphadenopathy:     Cervical: No cervical adenopathy.  Skin:    General: Skin is warm and dry.  Neurological:     Mental Status: She is alert.  Psychiatric:        Mood and Affect: Mood normal.        Behavior: Behavior normal.    BP 128/70 (BP Location: Left Arm, Patient Position: Sitting, Cuff Size: Large)   Pulse 96   Temp (!) 97.2 F (36.2 C) (Temporal)   Ht 5\' 4"  (1.626 m)   Wt 282 lb 3.2 oz (128 kg)   SpO2 95%   BMI 48.44 kg/m  Wt Readings from Last 3 Encounters:  12/25/20 282 lb 3.2 oz (128 kg)  10/31/20 273 lb (123.8 kg)  10/26/20 273 lb 9.6 oz (124.1 kg)     Health Maintenance Due  Topic Date Due   TETANUS/TDAP  Never done   Zoster Vaccines- Shingrix (1 of 2) Never done   Pneumococcal Vaccine 83-40 Years old (2 - PCV) 10/19/2020    There are no preventive care reminders to display for this patient.  Lab Results  Component Value Date   TSH 0.83 06/29/2020   Lab Results  Component Value Date   WBC 7.2 12/25/2020   HGB 13.7 12/25/2020   HCT 41.8 12/25/2020   MCV 88.8 12/25/2020   PLT 190.0 12/25/2020   Lab  Results  Component Value Date   NA 141 06/29/2020   K 4.1 06/29/2020   CO2 23 06/29/2020   GLUCOSE 95 06/29/2020   BUN 9 06/29/2020   CREATININE 0.65 06/29/2020   BILITOT 0.3 06/29/2020   ALKPHOS 49 06/29/2020   AST 15 06/29/2020   ALT 14 06/29/2020   PROT 5.7 (L) 06/29/2020   ALBUMIN 3.7 06/29/2020   CALCIUM 8.7 06/29/2020   GFR 95.70 06/29/2020   Lab Results  Component Value Date   CHOL 188 06/29/2020   Lab Results  Component Value Date   HDL 60.60 06/29/2020   Lab Results  Component Value Date   LDLCALC 98 06/29/2020   Lab Results  Component Value Date   TRIG 143.0 06/29/2020   Lab Results  Component Value Date   CHOLHDL 3 06/29/2020   No results found for: HGBA1C    Assessment & Plan:   Problem List Items Addressed This Visit       Cardiovascular and Mediastinum   Pulmonary artery abnormality   Relevant Orders   Ambulatory referral to Cardiology     Respiratory   Reactive airway disease - Primary   Relevant Medications   benzonatate (TESSALON) 200 MG capsule   Other Relevant Orders   CBC (Completed)   Lower respiratory tract infection   Relevant Medications   benzonatate (TESSALON) 200 MG capsule   Other Relevant Orders   CBC (Completed)   DG Chest 2 View (Completed)     Other   Tobacco use   Relevant Orders   Ambulatory  Referral Lung Cancer Screening  Pulmonary   Other Visit Diagnoses     Sinusitis, unspecified chronicity, unspecified location       Relevant Medications   benzonatate (TESSALON) 200 MG capsule   amoxicillin-clavulanate (AUGMENTIN) 875-125 MG tablet   dextromethorphan-guaiFENesin (MUCINEX DM) 30-600 MG 12hr tablet       Meds ordered this encounter  Medications   amoxicillin (AMOXIL) 875 MG tablet    Sig: Take 1 tablet (875 mg total) by mouth 2 (two) times daily for 10 days.    Dispense:  20 tablet    Refill:  0   predniSONE (DELTASONE) 20 MG tablet    Sig: Take 1 tablet (20 mg total) by mouth 2 (two)  times daily with a meal for 7 days.    Dispense:  14 tablet    Refill:  0   benzonatate (TESSALON) 200 MG capsule    Sig: Take 1 capsule (200 mg total) by mouth 2 (two) times daily as needed for cough.    Dispense:  20 capsule    Refill:  0   amoxicillin-clavulanate (AUGMENTIN) 875-125 MG tablet    Sig: Take 1 tablet by mouth 2 (two) times daily.    Dispense:  20 tablet    Refill:  0   dextromethorphan-guaiFENesin (MUCINEX DM) 30-600 MG 12hr tablet    Sig: Take 1 tablet by mouth 2 (two) times daily as needed for cough.    Dispense:  20 tablet    Refill:  0    Follow-up: Return in about 2 weeks (around 01/08/2021), or if symptoms worsen or fail to improve.  Patient certainly has reactive airway disease and if she does not have COPD she is certainly on her way.  Encourage smoking cessation.  Congratulated her on her decrease of tobacco usage.  Advised pulmonary consult but patient is unable to do that right now secondary to transportation issues.  Certainly qualifies for screening CT of the chest.  Of asked for consultation for this.  Follow-up in 2 weeks if not improving.  Libby Maw, MD     12/12 addendum: Called patient to discuss findings on chest x-ray with enlarging pulmonary arteries.  She reports increased postnasal drip with cough productive of yellow or green phlegm.  Drainage seems to be coming from the head area.  There is some pressure in the sinus area.  There is some pressure in the chest.  We will discuss findings of chest x-ray in more detail tomorrow.  Have sent in prescription for Augmentin and Mucinex DM.

## 2020-12-25 NOTE — Telephone Encounter (Signed)
Pt called and needs work note for today's appt. 12/25/20. Can come pick it up in a day or so.

## 2020-12-26 ENCOUNTER — Encounter: Payer: Self-pay | Admitting: Family Medicine

## 2020-12-26 LAB — CBC
HCT: 41.8 % (ref 36.0–46.0)
Hemoglobin: 13.7 g/dL (ref 12.0–15.0)
MCHC: 32.7 g/dL (ref 30.0–36.0)
MCV: 88.8 fl (ref 78.0–100.0)
Platelets: 190 10*3/uL (ref 150.0–400.0)
RBC: 4.71 Mil/uL (ref 3.87–5.11)
RDW: 14.2 % (ref 11.5–15.5)
WBC: 7.2 10*3/uL (ref 4.0–10.5)

## 2020-12-26 NOTE — Telephone Encounter (Signed)
Note written patient aware ready for pick up.  

## 2021-01-08 DIAGNOSIS — Q2579 Other congenital malformations of pulmonary artery: Secondary | ICD-10-CM | POA: Insufficient documentation

## 2021-01-08 MED ORDER — AMOXICILLIN-POT CLAVULANATE 875-125 MG PO TABS: 1.0000 | ORAL_TABLET | Freq: Two times a day (BID) | ORAL | 0 refills | Status: DC

## 2021-01-08 MED ORDER — DM-GUAIFENESIN ER 30-600 MG PO TB12: 1.0000 | ORAL_TABLET | Freq: Two times a day (BID) | ORAL | 0 refills | Status: DC | PRN

## 2021-01-08 NOTE — Addendum Note (Signed)
Addended by: Jon Billings on: 01/08/2021 03:26 PM   Modules accepted: Orders

## 2021-01-09 ENCOUNTER — Ambulatory Visit (INDEPENDENT_AMBULATORY_CARE_PROVIDER_SITE_OTHER): Payer: 59 | Admitting: Family Medicine

## 2021-01-09 ENCOUNTER — Other Ambulatory Visit: Payer: Self-pay

## 2021-01-09 ENCOUNTER — Encounter: Payer: Self-pay | Admitting: Family Medicine

## 2021-01-09 VITALS — BP 126/76 | HR 103 | Temp 96.0°F | Ht 64.0 in | Wt 285.6 lb

## 2021-01-09 DIAGNOSIS — Q2579 Other congenital malformations of pulmonary artery: Secondary | ICD-10-CM

## 2021-01-09 DIAGNOSIS — J22 Unspecified acute lower respiratory infection: Secondary | ICD-10-CM

## 2021-01-09 DIAGNOSIS — Z720 Tobacco use: Secondary | ICD-10-CM

## 2021-01-09 DIAGNOSIS — J019 Acute sinusitis, unspecified: Secondary | ICD-10-CM | POA: Insufficient documentation

## 2021-01-09 LAB — CBC
HCT: 41.9 % (ref 36.0–46.0)
Hemoglobin: 13.6 g/dL (ref 12.0–15.0)
MCHC: 32.4 g/dL (ref 30.0–36.0)
MCV: 89.6 fl (ref 78.0–100.0)
Platelets: 185 10*3/uL (ref 150.0–400.0)
RBC: 4.68 Mil/uL (ref 3.87–5.11)
RDW: 14.5 % (ref 11.5–15.5)
WBC: 13.4 10*3/uL — ABNORMAL HIGH (ref 4.0–10.5)

## 2021-01-09 NOTE — Progress Notes (Addendum)
Established Patient Office Visit  Subjective:  Patient ID: Sandra Stafford, female    DOB: [birth year]  Age: 60 y.o. MRN: [id number]  CC:  Chief Complaint  Patient presents with   Follow-up    2 week follow up, per patient not feeling well there is some improvement.     HPI Sandra Stafford presents for follow-up of lower respiratory tract infection, abnormal chest x-ray with new symptoms to include facial pressure purulent postnasal drip.  Cough persists but seems to be improving.  Continues with some DOE.  Is reduced tobacco usage to a quarter of a pack a day.  Admits recent weight gain.  No history of hypertension.  Chest x-rays show enlarging pulmonary arteries.  Question of borderline cardiomegaly.  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   Qualifier: Diagnosis of  By: Enrique Sack     Class 3 severe obesity due to excess calories with body mass index (BMI) of 40.0 to 44.9 in adult ([place]) 11/03/2006   Qualifier: Diagnosis of  By: Enrique Sack     Depression    Depression with anxiety 06/02/2019   DOE (dyspnea on exertion) 08/04/2019   DYSPEPSIA 11/03/2006   Qualifier: History of  By: Enrique Sack     Fibroid    Genital warts    GERD (gastroesophageal reflux disease)    Healthcare maintenance 05/17/2019   Heart murmur 07/05/2019   Medication side effect 10/20/2019   Muscle cramps 06/02/2019   Need for 23-polyvalent pneumococcal polysaccharide vaccine 10/20/2019   Need for influenza vaccination 10/20/2019   Polyarthralgia 05/17/2019   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[place]) 05/17/2019   PUD 11/03/2006   Qualifier: Diagnosis of  By: Enrique Sack     Seasonal allergic rhinitis due to pollen 06/02/2019   STD (sexually transmitted disease)    hpv  (genital warts   Stress incontinence 11/22/2019   Tobacco use 11/03/2006   Qualifier: Diagnosis of  By: Enrique Sack     VENEREAL WART 11/03/2006   Qualifier: Diagnosis of  By: Enrique Sack     Vitamin D deficiency 05/22/2019    Past Surgical History:  Procedure Laterality Date   ABDOMINAL HYSTERECTOMY     CERVIX LESION DESTRUCTION     CESAREAN SECTION     SALPINGECTOMY      Family History  Problem Relation Age of Onset   Cancer Mother    Diabetes Mother    Hypertension Mother    Diabetes Father    Colon cancer Cousin     Social History   Socioeconomic History   Marital status: Single    Spouse name: Not on file   Number of children: Not on file   Years of education: Not on file   Highest education level: Not on file  Occupational History   Not on file  Tobacco Use   Smoking status: Every Day    Types: Cigarettes   Smokeless tobacco: Never  Vaping Use   Vaping Use: Never used  Substance and Sexual Activity   Alcohol use: Not Currently   Drug use: No   Sexual activity: Not Currently    Birth control/protection: Surgical    Comment: hysterectomy  Other Topics Concern  Not on file  Social History Narrative   Not on file   Social Determinants of Health   Financial Resource Strain: Not on file  Food Insecurity: Not on file  Transportation Needs: Not on file  Physical Activity: Not on file  Stress: Not on file  Social Connections: Not on file  Intimate Partner Violence: Not on file    Outpatient Medications Prior to Visit  Medication Sig Dispense Refill   Acetaminophen (TYLENOL ARTHRITIS PAIN PO) Take by mouth.     albuterol (VENTOLIN HFA) 108 (90 Base) MCG/ACT inhaler Inhale 1-2 puffs into the lungs every 6 (six) hours as needed for wheezing or shortness of breath. Or cough. 8 g 2   amoxicillin-clavulanate (AUGMENTIN) 875-125 MG tablet Take 1 tablet by mouth 2 (two) times daily. 20 tablet 0   benzonatate (TESSALON) 200 MG capsule Take 1 capsule  (200 mg total) by mouth 2 (two) times daily as needed for cough. 20 capsule 0   Cetirizine HCl 10 MG CAPS Take 1 capsule by mouth as needed.     DULoxetine (CYMBALTA) 60 MG capsule Take 1 capsule (60 mg total) by mouth 2 (two) times daily. 180 capsule 1   fluticasone (FLONASE) 50 MCG/ACT nasal spray Use 2 spray(s) in each nostril once daily 16 g 0   methocarbamol (ROBAXIN) 500 MG tablet Take 1 tablet (500 mg total) by mouth every 8 (eight) hours as needed for muscle spasms. 60 tablet 1   pantoprazole (PROTONIX) 20 MG tablet Take 1 tablet (20 mg total) by mouth daily. 90 tablet 0   dextromethorphan-guaiFENesin (MUCINEX DM) 30-600 MG 12hr tablet Take 1 tablet by mouth 2 (two) times daily as needed for cough. (Patient not taking: Reported on 01/09/2021) 20 tablet 0   No facility-administered medications prior to visit.    Allergies  Allergen Reactions   Dust Mite Mixed Allergen Ext [Mite (D. Farinae)]    Naproxen Other (See Comments)    Abd pain    Pollen Extract     ROS Review of Systems  Constitutional:  Negative for chills, diaphoresis, fatigue, fever and unexpected weight change.  HENT:  Positive for congestion, postnasal drip, rhinorrhea, sinus pressure and sinus pain.   Eyes:  Negative for photophobia and visual disturbance.  Respiratory:  Positive for cough and shortness of breath. Negative for wheezing.   Cardiovascular:  Negative for chest pain and palpitations.  Gastrointestinal: Negative.   Neurological:  Negative for speech difficulty, weakness, light-headedness and headaches.  Psychiatric/Behavioral: Negative.       Objective:    Physical Exam Vitals and nursing note reviewed.  Constitutional:      General: She is not in acute distress.    Appearance: Normal appearance. She is obese. She is not ill-appearing, toxic-appearing or diaphoretic.  HENT:     Head: Normocephalic and atraumatic.     Right Ear: Tympanic membrane, ear canal and external ear normal.     Left  Ear: Tympanic membrane, ear canal and external ear normal.     Nose: No congestion.     Right Sinus: Frontal sinus tenderness present.     Left Sinus: Frontal sinus tenderness present.     Mouth/Throat:     Mouth: Mucous membranes are moist.     Pharynx: Oropharynx is clear. No oropharyngeal exudate or posterior oropharyngeal erythema.  Eyes:     General: No scleral icterus.       Right eye: No discharge.        Left eye: No  discharge.     Extraocular Movements: Extraocular movements intact.     Conjunctiva/sclera: Conjunctivae normal.     Pupils: Pupils are equal, round, and reactive to light.  Cardiovascular:     Rate and Rhythm: Normal rate and regular rhythm.  Pulmonary:     Effort: Pulmonary effort is normal. No respiratory distress.     Breath sounds: Normal breath sounds. No wheezing or rales.  Abdominal:     General: Bowel sounds are normal.  Musculoskeletal:     Cervical back: No rigidity or tenderness.  Lymphadenopathy:     Cervical: No cervical adenopathy.  Skin:    General: Skin is warm and dry.  Neurological:     Mental Status: She is alert and oriented to person, place, and time.  Psychiatric:        Mood and Affect: Mood normal.        Behavior: Behavior normal.    BP 126/76 (BP Location: Left Arm, Patient Position: Sitting, Cuff Size: Large)    Pulse (!) 103    Temp (!) 96 F (35.6 C) (Temporal)    Ht 5\' 4"  (1.626 m)    Wt 285 lb 9.6 oz (129.5 kg)    SpO2 95%    BMI 49.02 kg/m  Wt Readings from Last 3 Encounters:  01/09/21 285 lb 9.6 oz (129.5 kg)  12/25/20 282 lb 3.2 oz (128 kg)  10/31/20 273 lb (123.8 kg)     Health Maintenance Due  Topic Date Due   TETANUS/TDAP  Never done   Zoster Vaccines- Shingrix (1 of 2) Never done   Pneumococcal Vaccine 60-62 Years old (2 - PCV) 10/19/2020    There are no preventive care reminders to display for this patient.  Lab Results  Component Value Date   TSH 0.83 06/29/2020   Lab Results  Component Value Date    WBC 7.2 12/25/2020   HGB 13.7 12/25/2020   HCT 41.8 12/25/2020   MCV 88.8 12/25/2020   PLT 190.0 12/25/2020   Lab Results  Component Value Date   NA 141 06/29/2020   K 4.1 06/29/2020   CO2 23 06/29/2020   GLUCOSE 95 06/29/2020   BUN 9 06/29/2020   CREATININE 0.65 06/29/2020   BILITOT 0.3 06/29/2020   ALKPHOS 49 06/29/2020   AST 15 06/29/2020   ALT 14 06/29/2020   PROT 5.7 (L) 06/29/2020   ALBUMIN 3.7 06/29/2020   CALCIUM 8.7 06/29/2020   GFR 95.70 06/29/2020   Lab Results  Component Value Date   CHOL 188 06/29/2020   Lab Results  Component Value Date   HDL 60.60 06/29/2020   Lab Results  Component Value Date   LDLCALC 98 06/29/2020   Lab Results  Component Value Date   TRIG 143.0 06/29/2020   Lab Results  Component Value Date   CHOLHDL 3 06/29/2020   No results found for: HGBA1C    Assessment & Plan:   Problem List Items Addressed This Visit       Cardiovascular and Mediastinum   Pulmonary artery abnormality - Primary     Respiratory   Lower respiratory tract infection   Relevant Orders   CBC   Acute non-recurrent sinusitis   Relevant Orders   CBC     Other   Tobacco use    No orders of the defined types were placed in this encounter.   Follow-up: Return in about 3 months (around 04/09/2021), or complete augmentin..   Will see cardiology for evaluation of cardiomegaly  and pulmonary hypertension.  Continue smoking cessation.  Libby Maw, MD

## 2021-01-15 ENCOUNTER — Ambulatory Visit: Payer: 59 | Admitting: Family Medicine

## 2021-01-17 ENCOUNTER — Telehealth: Payer: Self-pay | Admitting: Family Medicine

## 2021-01-17 NOTE — Telephone Encounter (Signed)
Patient notified [facility] phone. No further questions and states she is feeling better. Dm/cma

## 2021-01-17 NOTE — Telephone Encounter (Signed)
Pt is call concerning her most recent labs. Please advise at [phone]

## 2021-04-09 ENCOUNTER — Other Ambulatory Visit: Payer: Self-pay

## 2021-04-09 ENCOUNTER — Ambulatory Visit (INDEPENDENT_AMBULATORY_CARE_PROVIDER_SITE_OTHER): Payer: Managed Care, Other (non HMO) | Admitting: Family Medicine

## 2021-04-09 ENCOUNTER — Encounter: Payer: Self-pay | Admitting: Family Medicine

## 2021-04-09 VITALS — BP 122/74 | HR 99 | Temp 97.0°F | Ht 64.0 in | Wt 295.4 lb

## 2021-04-09 DIAGNOSIS — G8929 Other chronic pain: Secondary | ICD-10-CM

## 2021-04-09 DIAGNOSIS — K219 Gastro-esophageal reflux disease without esophagitis: Secondary | ICD-10-CM | POA: Diagnosis not present

## 2021-04-09 DIAGNOSIS — M545 Low back pain, unspecified: Secondary | ICD-10-CM | POA: Diagnosis not present

## 2021-04-09 DIAGNOSIS — Z720 Tobacco use: Secondary | ICD-10-CM

## 2021-04-09 DIAGNOSIS — M2550 Pain in unspecified joint: Secondary | ICD-10-CM

## 2021-04-09 DIAGNOSIS — J301 Allergic rhinitis due to pollen: Secondary | ICD-10-CM

## 2021-04-09 MED ORDER — GABAPENTIN 300 MG PO CAPS
ORAL_CAPSULE | ORAL | 1 refills | Status: DC
Start: 2021-04-09 — End: 2022-05-29

## 2021-04-09 MED ORDER — PANTOPRAZOLE SODIUM 20 MG PO TBEC: 20.0000 mg | DELAYED_RELEASE_TABLET | Freq: Every day | ORAL | 3 refills | Status: DC

## 2021-04-09 MED ORDER — FLUTICASONE PROPIONATE 50 MCG/ACT NA SUSP: NASAL | 6 refills | Status: DC

## 2021-04-09 MED ORDER — CETIRIZINE HCL 10 MG PO CAPS: 1.0000 | ORAL_CAPSULE | ORAL | 5 refills | Status: DC | PRN

## 2021-04-09 NOTE — Progress Notes (Signed)
Established Patient Office Visit  Subjective:  Patient ID: Sandra Stafford, female    DOB: 1960-04-18  Age: 61 y.o. MRN: [id number]  CC:  Chief Complaint  Patient presents with   Follow-up    3 month follow up, no concerns. Little congestion, pain medication not working.     HPI Sandra Stafford presents for follow-up follow-up of seasonal allergy symptoms treated successfully with cetirizine and Flonase.  Reflux disease.  Recent EGD showed gastritis with a normal esophagus and pantoprazole has been helpful.  She is cut back her smoking.  She is down to a third of a pack.  Continues to have generalized body aches and pains to include her hands arms legs.  She has specific lower back pain that is across the lower part of her back.  It is nonradiating.  She has paresthesias in her right thigh.  There is no change in her bowel or bladder function.  No weakness.  She also has generalized pains in her legs but the pain does not radiate from the back into the leg area.  Her back pain is ongoing.  She has been to sports medicine.  Wants to go back to work but has difficulty standing.  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   Qualifier: Diagnosis of  By: Enrique Sack     Class 3 severe obesity due to excess calories with body mass index (BMI) of 40.0 to 44.9 in adult ([place]) 11/03/2006   Qualifier: Diagnosis of  By: Enrique Sack     Depression    Depression with anxiety 06/02/2019   DOE (dyspnea on exertion) 08/04/2019   DYSPEPSIA 11/03/2006   Qualifier: History of  By: Enrique Sack     Fibroid    Genital warts    GERD (gastroesophageal reflux disease)    Healthcare maintenance 05/17/2019   Heart murmur 07/05/2019   Medication side effect 10/20/2019    Muscle cramps 06/02/2019   Need for 23-polyvalent pneumococcal polysaccharide vaccine 10/20/2019   Need for influenza vaccination 10/20/2019   Polyarthralgia 05/17/2019   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[place]) 05/17/2019   PUD 11/03/2006   Qualifier: Diagnosis of  By: Enrique Sack     Seasonal allergic rhinitis due to pollen 06/02/2019   STD (sexually transmitted disease)    hpv (genital warts   Stress incontinence 11/22/2019   Tobacco use 11/03/2006   Qualifier: Diagnosis of  By: Enrique Sack     VENEREAL WART 11/03/2006   Qualifier: Diagnosis of  By: Enrique Sack     Vitamin D deficiency 05/22/2019    Past Surgical History:  Procedure Laterality Date   ABDOMINAL HYSTERECTOMY     CERVIX LESION DESTRUCTION     CESAREAN SECTION     SALPINGECTOMY      Family History  Problem Relation Age of Onset   Cancer Mother    Diabetes Mother    Hypertension Mother    Diabetes Father    Colon cancer Cousin     Social History   Socioeconomic History   Marital status: Single    Spouse name: Not on file   Number of children: Not on file   Years of education: Not on file  Highest education level: Not on file  Occupational History   Not on file  Tobacco Use   Smoking status: Every Day    Types: Cigarettes   Smokeless tobacco: Never  Vaping Use   Vaping Use: Never used  Substance and Sexual Activity   Alcohol use: Not Currently   Drug use: No   Sexual activity: Not Currently    Birth control/protection: Surgical    Comment: hysterectomy  Other Topics Concern   Not on file  Social History Narrative   Not on file   Social Determinants of Health   Financial Resource Strain: Not on file  Food Insecurity: Not on file  Transportation Needs: Not on file  Physical Activity: Not on file  Stress: Not on file  Social Connections: Not on file  Intimate Partner Violence: Not on file    Outpatient Medications Prior to Visit  Medication Sig Dispense Refill   Acetaminophen (TYLENOL  ARTHRITIS PAIN PO) Take by mouth.     albuterol (VENTOLIN HFA) 108 (90 Base) MCG/ACT inhaler Inhale 1-2 puffs into the lungs every 6 (six) hours as needed for wheezing or shortness of breath. Or cough. 8 g 2   DULoxetine (CYMBALTA) 60 MG capsule Take 1 capsule (60 mg total) by mouth 2 (two) times daily. 180 capsule 1   amoxicillin-clavulanate (AUGMENTIN) 875-125 MG tablet Take 1 tablet by mouth 2 (two) times daily. 20 tablet 0   Cetirizine HCl 10 MG CAPS Take 1 capsule by mouth as needed.     fluticasone (FLONASE) 50 MCG/ACT nasal spray Use 2 spray(s) in each nostril once daily 16 g 0   pantoprazole (PROTONIX) 20 MG tablet Take 1 tablet (20 mg total) by mouth daily. 90 tablet 0   methocarbamol (ROBAXIN) 500 MG tablet Take 1 tablet (500 mg total) by mouth every 8 (eight) hours as needed for muscle spasms. (Patient not taking: Reported on 04/09/2021) 60 tablet 1   benzonatate (TESSALON) 200 MG capsule Take 1 capsule (200 mg total) by mouth 2 (two) times daily as needed for cough. (Patient not taking: Reported on 04/09/2021) 20 capsule 0   dextromethorphan-guaiFENesin (MUCINEX DM) 30-600 MG 12hr tablet Take 1 tablet by mouth 2 (two) times daily as needed for cough. (Patient not taking: Reported on 01/09/2021) 20 tablet 0   No facility-administered medications prior to visit.    Allergies  Allergen Reactions   Dust Mite Mixed Allergen Ext [Mite (D. Farinae)]    Naproxen Other (See Comments)    Abd pain    Pollen Extract     ROS Review of Systems  Constitutional:  Negative for chills, diaphoresis, fatigue, fever and unexpected weight change.  HENT: Negative.    Eyes:  Negative for photophobia and visual disturbance.  Respiratory: Negative.    Cardiovascular: Negative.   Gastrointestinal: Negative.   Musculoskeletal:  Positive for arthralgias, back pain and myalgias.  Neurological:  Negative for speech difficulty and weakness.     Objective:    Physical Exam Vitals and nursing note  reviewed.  Constitutional:      General: She is not in acute distress.    Appearance: Normal appearance. She is obese. She is not ill-appearing, toxic-appearing or diaphoretic.  HENT:     Head: Normocephalic and atraumatic.     Right Ear: External ear normal.     Left Ear: External ear normal.  Eyes:     General:        Right eye: No discharge.  Left eye: No discharge.     Conjunctiva/sclera: Conjunctivae normal.  Pulmonary:     Effort: Pulmonary effort is normal.  Skin:    General: Skin is warm and dry.  Neurological:     Mental Status: She is alert and oriented to person, place, and time.  Psychiatric:        Mood and Affect: Mood normal.        Behavior: Behavior normal.    BP 122/74 (BP Location: Left Arm, Patient Position: Sitting, Cuff Size: Large)    Pulse 99    Temp (!) 97 F (36.1 C) (Temporal)    Ht '5\' 4"'$  (1.626 m)    Wt 295 lb 6.4 oz (134 kg)    SpO2 95%    BMI 50.71 kg/m  Wt Readings from Last 3 Encounters:  04/09/21 295 lb 6.4 oz (134 kg)  01/09/21 285 lb 9.6 oz (129.5 kg)  12/25/20 282 lb 3.2 oz (128 kg)     There are no preventive care reminders to display for this patient.  There are no preventive care reminders to display for this patient.  Lab Results  Component Value Date   TSH 0.83 06/29/2020   Lab Results  Component Value Date   WBC 13.4 (H) 01/09/2021   HGB 13.6 01/09/2021   HCT 41.9 01/09/2021   MCV 89.6 01/09/2021   PLT 185.0 01/09/2021   Lab Results  Component Value Date   NA 141 06/29/2020   K 4.1 06/29/2020   CO2 23 06/29/2020   GLUCOSE 95 06/29/2020   BUN 9 06/29/2020   CREATININE 0.65 06/29/2020   BILITOT 0.3 06/29/2020   ALKPHOS 49 06/29/2020   AST 15 06/29/2020   ALT 14 06/29/2020   PROT 5.7 (L) 06/29/2020   ALBUMIN 3.7 06/29/2020   CALCIUM 8.7 06/29/2020   GFR 95.70 06/29/2020   Lab Results  Component Value Date   CHOL 188 06/29/2020   Lab Results  Component Value Date   HDL 60.60 06/29/2020   Lab Results   Component Value Date   LDLCALC 98 06/29/2020   Lab Results  Component Value Date   TRIG 143.0 06/29/2020   Lab Results  Component Value Date   CHOLHDL 3 06/29/2020   No results found for: HGBA1C    Assessment & Plan:   Problem List Items Addressed This Visit       Respiratory   Allergic rhinitis   Relevant Medications   Cetirizine HCl 10 MG CAPS   fluticasone (FLONASE) 50 MCG/ACT nasal spray     Digestive   GERD (gastroesophageal reflux disease)   Relevant Medications   pantoprazole (PROTONIX) 20 MG tablet     Other   Arthralgia - Primary   Relevant Medications   gabapentin (NEURONTIN) 300 MG capsule   Chronic midline low back pain   Relevant Medications   gabapentin (NEURONTIN) 300 MG capsule   Other Relevant Orders   MR Lumbar Spine Wo Contrast    Meds ordered this encounter  Medications   Cetirizine HCl 10 MG CAPS    Sig: Take 1 capsule (10 mg total) by mouth as needed.    Dispense:  30 capsule    Refill:  5   fluticasone (FLONASE) 50 MCG/ACT nasal spray    Sig: Use 2 spray(s) in each nostril once daily    Dispense:  16 g    Refill:  6   pantoprazole (PROTONIX) 20 MG tablet    Sig: Take 1 tablet (20 mg total)  by mouth daily.    Dispense:  90 tablet    Refill:  3    Please call [phone] to schedule an Office Visit for further refills   gabapentin (NEURONTIN) 300 MG capsule    Sig: Take 1 capsule (300 mg total) by mouth at bedtime for 7 days, THEN 1 capsule (300 mg total) 2 (two) times daily for 7 days, THEN 1 capsule (300 mg total) 3 (three) times daily.    Dispense:  90 capsule    Refill:  1    Follow-up: Return in about 3 months (around 07/10/2021).  Patient has specific lower back pain but also seems to have generalized pain syndrome.  We will continue Cymbalta.  Have added gabapentin with an upper dose taper.  MRI of lower back.  Work note to ask for sit down as needed.  Libby Maw, MD

## 2021-04-10 ENCOUNTER — Telehealth: Payer: Self-pay | Admitting: Family Medicine

## 2021-04-10 ENCOUNTER — Telehealth (HOSPITAL_BASED_OUTPATIENT_CLINIC_OR_DEPARTMENT_OTHER): Payer: Self-pay

## 2021-04-10 NOTE — Telephone Encounter (Signed)
Message below FYI.

## 2021-04-10 NOTE — Telephone Encounter (Signed)
Pt feels like she is having a reaction to her gabapentin. She is feeling dizzy, she has a headache, her heart is racing and she has a sore throat. I transferred her over to Nurse Triage.  ?

## 2021-04-10 NOTE — Telephone Encounter (Signed)
Triage nurse called to report this pt called them to report she took her Gabapentin and woke this morning with heart palpitations, dizziness, headache, and drowsiness. She also stated she will not be taking her gabapentin tonight. The triage nurse recommended the ED but she refused. She wanted it documented. ?

## 2021-04-23 ENCOUNTER — Encounter: Payer: Self-pay | Admitting: Family Medicine

## 2021-04-23 ENCOUNTER — Ambulatory Visit (INDEPENDENT_AMBULATORY_CARE_PROVIDER_SITE_OTHER): Payer: Managed Care, Other (non HMO) | Admitting: Family Medicine

## 2021-04-23 VITALS — BP 128/70 | HR 107 | Temp 95.6°F | Ht 64.0 in | Wt 301.0 lb

## 2021-04-23 DIAGNOSIS — J309 Allergic rhinitis, unspecified: Secondary | ICD-10-CM

## 2021-04-23 DIAGNOSIS — H6981 Other specified disorders of Eustachian tube, right ear: Secondary | ICD-10-CM

## 2021-04-23 DIAGNOSIS — F418 Other specified anxiety disorders: Secondary | ICD-10-CM

## 2021-04-23 DIAGNOSIS — J4521 Mild intermittent asthma with (acute) exacerbation: Secondary | ICD-10-CM | POA: Diagnosis not present

## 2021-04-23 DIAGNOSIS — M2550 Pain in unspecified joint: Secondary | ICD-10-CM

## 2021-04-23 MED ORDER — PREDNISONE 20 MG PO TABS: 20.0000 mg | ORAL_TABLET | Freq: Two times a day (BID) | ORAL | 0 refills | Status: AC

## 2021-04-23 MED ORDER — DULOXETINE HCL 60 MG PO CPEP: 60.0000 mg | ORAL_CAPSULE | Freq: Two times a day (BID) | ORAL | 1 refills | Status: DC

## 2021-04-23 NOTE — Progress Notes (Signed)
? ?Established Patient Office Visit ? ?Subjective:  ?Patient ID: Sandra Stafford, female    DOB: Jul 13, 1960  Age: 61 y.o. MRN: [id number] ? ?CC:  ?Chief Complaint  ?Patient presents with  ? Acute Visit  ?  Pt c/o sinus infection and swelling of the left leg painful to walk on and to touch  ? ? ?HPI ?Sandra Stafford presents for treatment of ongoing issue with runny nose clear rhinorrhea, postnasal drip, ear congestion, cough with wheezing.  Mucus was initially discolored but now is cleared.  There has been no fever or chills.  Has been taking her cetirizine but has not restarted her Flonase.  She has had a pain across the top of her left foot radiating up into her shin area.  She has been out of her Cymbalta. ? ?Past Medical History:  ?Diagnosis Date  ? Abnormal Pap smear of cervix   ? ALLERGIC RHINITIS 11/03/2006  ? Qualifier: Diagnosis of  By: Enrique Sack    ? Allergy   ? Anxiety   ? Aortic stenosis mild, based on echo from 2021 10/01/2019  ? Asthma   ? 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 Qualifier: Diagnosis of  By: Enrique Sack    ? Class 3 severe obesity due to excess calories with body mass index (BMI) of 40.0 to 44.9 in adult [facility]) 11/03/2006  ? Qualifier: Diagnosis of  By: Enrique Sack    ? Depression   ? Depression with anxiety 06/02/2019  ? DOE (dyspnea on exertion) 08/04/2019  ? DYSPEPSIA 11/03/2006  ? Qualifier: History of  By: Enrique Sack    ? Fibroid   ? Genital warts   ? GERD (gastroesophageal reflux disease)   ? Healthcare maintenance 05/17/2019  ? Heart murmur 07/05/2019  ? Medication side effect 10/20/2019  ? Muscle cramps 06/02/2019  ? Need for 23-polyvalent pneumococcal polysaccharide vaccine 10/20/2019  ? Need for influenza vaccination 10/20/2019  ? Polyarthralgia 05/17/2019  ? 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 PUD 11/03/2006  ? Qualifier: Diagnosis of  By: Enrique Sack    ? Seasonal allergic rhinitis due to pollen 06/02/2019  ? STD (sexually transmitted disease)   ? hpv (genital warts   ? Stress incontinence 11/22/2019  ? Tobacco use 11/03/2006  ? Qualifier: Diagnosis of  By: Enrique Sack    ? VENEREAL WART 11/03/2006  ? Qualifier: Diagnosis of  By: Enrique Sack    ? Vitamin D deficiency 05/22/2019  ? ? ?Past Surgical History:  ?Procedure Laterality Date  ? ABDOMINAL HYSTERECTOMY    ? CERVIX LESION DESTRUCTION    ? CESAREAN SECTION    ? SALPINGECTOMY    ? ? ?Family History  ?Problem Relation Age of Onset  ? Cancer Mother   ? Diabetes Mother   ? Hypertension Mother   ? Diabetes Father   ? Colon cancer Cousin   ? ? ?Social History  ? ?Socioeconomic History  ? Marital status: Single  ?  Spouse name: Not on file  ? Number of children: Not on file  ? Years of education: Not on file  ? Highest education level: Not on file  ?Occupational History  ? Not on file  ?Tobacco Use  ? Smoking status: Every Day  ?  Types: Cigarettes  ? Smokeless tobacco: Never  ?Vaping Use  ? Vaping Use: Never used  ?Substance and Sexual Activity  ? Alcohol use: Not Currently  ? Drug use: No  ? Sexual activity: Not Currently  ?  Birth control/protection: Surgical  ?  Comment: hysterectomy  ?Other Topics Concern  ? Not on file  ?Social History Narrative  ? Not on file  ? ?Social Determinants of Health  ? ?Financial Resource Strain: Not on file  ?Food Insecurity: Not on file  ?Transportation Needs: Not on file  ?Physical Activity: Not on file  ?Stress: Not on file  ?Social Connections: Not on file  ?Intimate Partner Violence: Not on file  ? ? ?Outpatient Medications Prior to Visit  ?Medication Sig Dispense Refill  ? Acetaminophen (TYLENOL ARTHRITIS PAIN PO) Take by mouth.    ? albuterol (VENTOLIN HFA) 108 (90 Base) MCG/ACT inhaler Inhale 1-2 puffs into the lungs every 6 (six) hours as needed for wheezing or shortness of breath. Or cough. 8 g 2  ? Cetirizine HCl 10 MG CAPS Take 1 capsule (10 mg total) by mouth as needed. 30 capsule 5  ? fluticasone (FLONASE) 50 MCG/ACT nasal spray Use 2 spray(s) in each nostril once daily 16  g 6  ? gabapentin (NEURONTIN) 300 MG capsule Take 1 capsule (300 mg total) by mouth at bedtime for 7 days, THEN 1 capsule (300 mg total) 2 (two) times daily for 7 days, THEN 1 capsule (300 mg total) 3 (three) times daily. 90 capsule 1  ? pantoprazole (PROTONIX) 20 MG tablet Take 1 tablet (20 mg total) by mouth daily. 90 tablet 3  ? methocarbamol (ROBAXIN) 500 MG tablet Take 1 tablet (500 mg total) by mouth every 8 (eight) hours as needed for muscle spasms. (Patient not taking: Reported on 04/09/2021) 60 tablet 1  ? DULoxetine (CYMBALTA) 60 MG capsule Take 1 capsule (60 mg total) by mouth 2 (two) times daily. (Patient not taking: Reported on 04/23/2021) 180 capsule 1  ? ?No facility-administered medications prior to visit.  ? ? ?Allergies  ?Allergen Reactions  ? Dust Mite Mixed Allergen Ext [Mite (D. Farinae)]   ? Naproxen Other (See Comments)  ?  Abd pain   ? Pollen Extract   ? ? ?ROS ?Review of Systems  ?Constitutional:  Negative for chills, diaphoresis, fatigue, fever and unexpected weight change.  ?HENT:  Positive for congestion, hearing loss, postnasal drip, rhinorrhea and sinus pressure. Negative for ear pain and sinus pain.   ?Eyes:  Negative for photophobia and visual disturbance.  ?Respiratory:  Positive for cough and wheezing. Negative for shortness of breath.   ?Gastrointestinal: Negative.   ?Musculoskeletal:  Positive for arthralgias.  ?Neurological:  Negative for speech difficulty and weakness.  ?Psychiatric/Behavioral: Negative.    ? ?  ?Objective:  ?  ?Physical Exam ?Vitals and nursing note reviewed.  ?Constitutional:   ?   General: She is not in acute distress. ?   Appearance: Normal appearance. She is not ill-appearing or diaphoretic.  ?HENT:  ?   Head: Normocephalic and atraumatic.  ?   Right Ear: No middle ear effusion. Tympanic membrane is retracted. Tympanic membrane is not erythematous.  ?   Left Ear: Tympanic membrane is not erythematous or retracted.  ?   Mouth/Throat:  ?   Mouth: Mucous  membranes are moist.  ?   Pharynx: Oropharynx is clear. No oropharyngeal exudate or posterior oropharyngeal erythema.  ?Eyes:  ?   General: No scleral icterus.    ?   Right eye: No discharge.     ?   Left eye: No discharge.  ?   Extraocular Movements: Extraocular movements intact.  ?   Conjunctiva/sclera: Conjunctivae normal.  ?   Pupils: Pupils are equal, round, and reactive to light.  ?  Cardiovascular:  ?   Rate and Rhythm: Normal rate and regular rhythm.  ?   Pulses:     ?     Dorsalis pedis pulses are 2+ on the right side and 2+ on the left side.  ?     Posterior tibial pulses are 1+ on the right side and 1+ on the left side.  ?Pulmonary:  ?   Effort: Pulmonary effort is normal. No respiratory distress.  ?   Breath sounds: Normal breath sounds. No wheezing or rales.  ?Musculoskeletal:  ?   Cervical back: No rigidity or tenderness.  ?   Right lower leg: No edema.  ?   Left lower leg: No edema.  ?     Legs: ? ?Lymphadenopathy:  ?   Cervical: No cervical adenopathy.  ?Skin: ?   General: Skin is warm and dry.  ?Neurological:  ?   Mental Status: She is alert and oriented to person, place, and time.  ?Psychiatric:     ?   Mood and Affect: Mood normal.     ?   Behavior: Behavior normal.  ? ? ?BP 128/70 (BP Location: Left Arm, Patient Position: Sitting, Cuff Size: Normal)   Pulse (!) 107   Temp (!) 95.6 ?F (35.3 ?C) (Temporal)   Ht '5\' 4"'$  (1.626 m)   Wt (!) 301 lb (136.5 kg)   SpO2 93%   BMI 51.67 kg/m?  ?Wt Readings from Last 3 Encounters:  ?04/23/21 (!) 301 lb (136.5 kg)  ?04/09/21 295 lb 6.4 oz (134 kg)  ?01/09/21 285 lb 9.6 oz (129.5 kg)  ? ? ? ?There are no preventive care reminders to display for this patient. ? ?There are no preventive care reminders to display for this patient. ? ?Lab Results  ?Component Value Date  ? TSH 0.83 06/29/2020  ? ?Lab Results  ?Component Value Date  ? WBC 13.4 (H) 01/09/2021  ? HGB 13.6 01/09/2021  ? HCT 41.9 01/09/2021  ? MCV 89.6 01/09/2021  ? PLT 185.0 01/09/2021  ? ?Lab  Results  ?Component Value Date  ? NA 141 06/29/2020  ? K 4.1 06/29/2020  ? CO2 23 06/29/2020  ? GLUCOSE 95 06/29/2020  ? BUN 9 06/29/2020  ? CREATININE 0.65 06/29/2020  ? BILITOT 0.3 06/29/2020  ? ALKPHOS 49 06/29/2020

## 2021-07-10 ENCOUNTER — Encounter: Payer: Self-pay | Admitting: Family Medicine

## 2021-07-10 ENCOUNTER — Ambulatory Visit (INDEPENDENT_AMBULATORY_CARE_PROVIDER_SITE_OTHER): Payer: Commercial Managed Care - HMO | Admitting: Family Medicine

## 2021-07-10 VITALS — BP 122/88 | HR 88 | Temp 96.7°F | Ht 64.0 in | Wt 303.0 lb

## 2021-07-10 DIAGNOSIS — M2550 Pain in unspecified joint: Secondary | ICD-10-CM | POA: Diagnosis not present

## 2021-07-10 DIAGNOSIS — M79662 Pain in left lower leg: Secondary | ICD-10-CM

## 2021-07-10 DIAGNOSIS — G8929 Other chronic pain: Secondary | ICD-10-CM

## 2021-07-10 DIAGNOSIS — M79604 Pain in right leg: Secondary | ICD-10-CM

## 2021-07-10 DIAGNOSIS — M545 Low back pain, unspecified: Secondary | ICD-10-CM

## 2021-07-10 DIAGNOSIS — Q2579 Other congenital malformations of pulmonary artery: Secondary | ICD-10-CM

## 2021-07-10 DIAGNOSIS — M353 Polymyalgia rheumatica: Secondary | ICD-10-CM

## 2021-07-10 DIAGNOSIS — Z720 Tobacco use: Secondary | ICD-10-CM

## 2021-07-10 DIAGNOSIS — M79605 Pain in left leg: Secondary | ICD-10-CM

## 2021-07-10 MED ORDER — GABAPENTIN 300 MG PO CAPS
300.0000 mg | ORAL_CAPSULE | Freq: Three times a day (TID) | ORAL | 3 refills | Status: DC
Start: 2021-07-10 — End: 2022-05-29

## 2021-07-10 NOTE — Progress Notes (Signed)
Established Patient Office Visit  Subjective   Patient ID: Sandra Stafford, female    DOB: 1960/08/16  Age: 61 y.o. MRN: [id number]  Chief Complaint  Patient presents with   Follow-up    3 mo f/u. Left leg is swollen and not much progress from last visit 2 mo ago. Swelling started to go down since last weak. Stiff/ hard in most places from above the knee to the toes. Movement is limited and painful.      HPI follow-up of lower back pain, pain and swelling in her lower extremities.  Lower back pain is dull, intermittent and worse with standing.  Ordered MRI of the lower back was denied by her insurance company.  There is swelling lower extremities from the knees down.  Left seems to be worse to her.  However it is gradually improving.  She has been out of her Neurontin.  She has only been taking the gabapentin once daily.  Recent echocardiogram revealed normal left ventricular function.  This out of work for the last 7 months.  She has to stand on her job.    Review of Systems  Constitutional: Negative.   HENT: Negative.    Eyes:  Negative for blurred vision, discharge and redness.  Respiratory: Negative.    Cardiovascular: Negative.   Gastrointestinal:  Negative for abdominal pain.  Genitourinary: Negative.   Musculoskeletal:  Positive for back pain and myalgias.  Skin:  Negative for rash.  Neurological:  Negative for tingling, loss of consciousness and weakness.  Endo/Heme/Allergies:  Negative for polydipsia.      Objective:     BP 122/88 (BP Location: Left Arm, Patient Position: Sitting, Cuff Size: Large)   Pulse 88   Temp (!) 96.7 F (35.9 C) (Temporal)   Ht '5\' 4"'$  (1.626 m)   Wt (!) 303 lb (137.4 kg)   SpO2 96%   BMI 52.01 kg/m    Physical Exam Constitutional:      General: She is not in acute distress.    Appearance: Normal appearance. She is not ill-appearing, toxic-appearing or diaphoretic.  HENT:     Head: Normocephalic and atraumatic.     Right Ear: External  ear normal.     Left Ear: External ear normal.     Mouth/Throat:     Mouth: Mucous membranes are moist.     Pharynx: Oropharynx is clear. No oropharyngeal exudate or posterior oropharyngeal erythema.  Eyes:     General: No scleral icterus.       Right eye: No discharge.        Left eye: No discharge.     Extraocular Movements: Extraocular movements intact.     Conjunctiva/sclera: Conjunctivae normal.     Pupils: Pupils are equal, round, and reactive to light.  Cardiovascular:     Rate and Rhythm: Normal rate and regular rhythm.     Pulses:          Dorsalis pedis pulses are 2+ on the right side and 2+ on the left side.       Posterior tibial pulses are 1+ on the right side and 1+ on the left side.  Pulmonary:     Effort: Pulmonary effort is normal. No respiratory distress.     Breath sounds: Normal breath sounds.  Abdominal:     General: Bowel sounds are normal.     Tenderness: There is no abdominal tenderness. There is no guarding.  Musculoskeletal:     Cervical back: No rigidity or tenderness.  Lumbar back: No tenderness or bony tenderness.     Right lower leg: Swelling present. Edema (trace) present.     Left lower leg: Swelling present. Edema (trace) present.       Legs:  Skin:    General: Skin is warm and dry.  Neurological:     Mental Status: She is alert and oriented to person, place, and time.  Psychiatric:        Mood and Affect: Mood normal.        Behavior: Behavior normal.      No results found for any visits on 07/10/21.    The 10-year ASCVD risk score (Arnett DK, et al., 2019) is: 8.7%    Assessment & Plan:   Problem List Items Addressed This Visit       Musculoskeletal and Integument   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[place])   Relevant Medications   gabapentin (NEURONTIN) 300 MG capsule     Other   Polyarthralgia   Relevant Medications   gabapentin (NEURONTIN) 300 MG capsule   Chronic midline low back pain - Primary   Relevant Medications   gabapentin  (NEURONTIN) 300 MG capsule   Other Relevant Orders   Ambulatory referral to Orthopedic Surgery   Other Visit Diagnoses     Pain of left calf       Relevant Orders   Ambulatory referral to Vascular Surgery   Pain in both lower extremities       Relevant Orders   VAS [place] LOWER EXTREMITY VENOUS (DVT)   Arthralgia, unspecified joint       Relevant Medications   gabapentin (NEURONTIN) 300 MG capsule       Return in about 3 months (around 10/10/2021).  Refilled Neurontin to be taking 3 times daily.  Continue with duloxetine.  Referral to orthopedics for ongoing lower back pain.   Libby Maw, MD

## 2021-07-11 DIAGNOSIS — M79605 Pain in left leg: Secondary | ICD-10-CM | POA: Insufficient documentation

## 2021-07-11 DIAGNOSIS — M79671 Pain in right foot: Secondary | ICD-10-CM | POA: Insufficient documentation

## 2021-07-11 DIAGNOSIS — M79662 Pain in left lower leg: Secondary | ICD-10-CM | POA: Insufficient documentation

## 2021-07-11 DIAGNOSIS — M79604 Pain in right leg: Secondary | ICD-10-CM | POA: Insufficient documentation

## 2021-07-11 NOTE — Addendum Note (Signed)
Addended by: Jon Billings on: 07/11/2021 08:01 AM   Modules accepted: Orders

## 2021-08-07 ENCOUNTER — Telehealth: Payer: Self-pay | Admitting: Family Medicine

## 2021-08-07 NOTE — Telephone Encounter (Signed)
Pt called to follow up on status of disability paperwork. She said she brought it in at last appt 07/10/21.  Please call [phone]

## 2021-08-07 NOTE — Telephone Encounter (Signed)
Called to inform patient that forms were faxed on 07/13/21 but never went through after faxing several times. I called to see if there was a different number to fax to but it was not. Tried again today and went through with conformation. Left message informing patient due to no answer asked patient to call back with questions.

## 2021-08-13 ENCOUNTER — Ambulatory Visit: Payer: Commercial Managed Care - HMO | Admitting: Physician Assistant

## 2021-08-18 IMAGING — DX DG CHEST 2V
2 series · 2 of 2 positions shown · non-contrast
Comparison: Chest radiograph 03/02/2009

CLINICAL DATA: Dyspnea on exertion.  Current smoker.

EXAM:
CHEST - 2 VIEW

[chest pa]
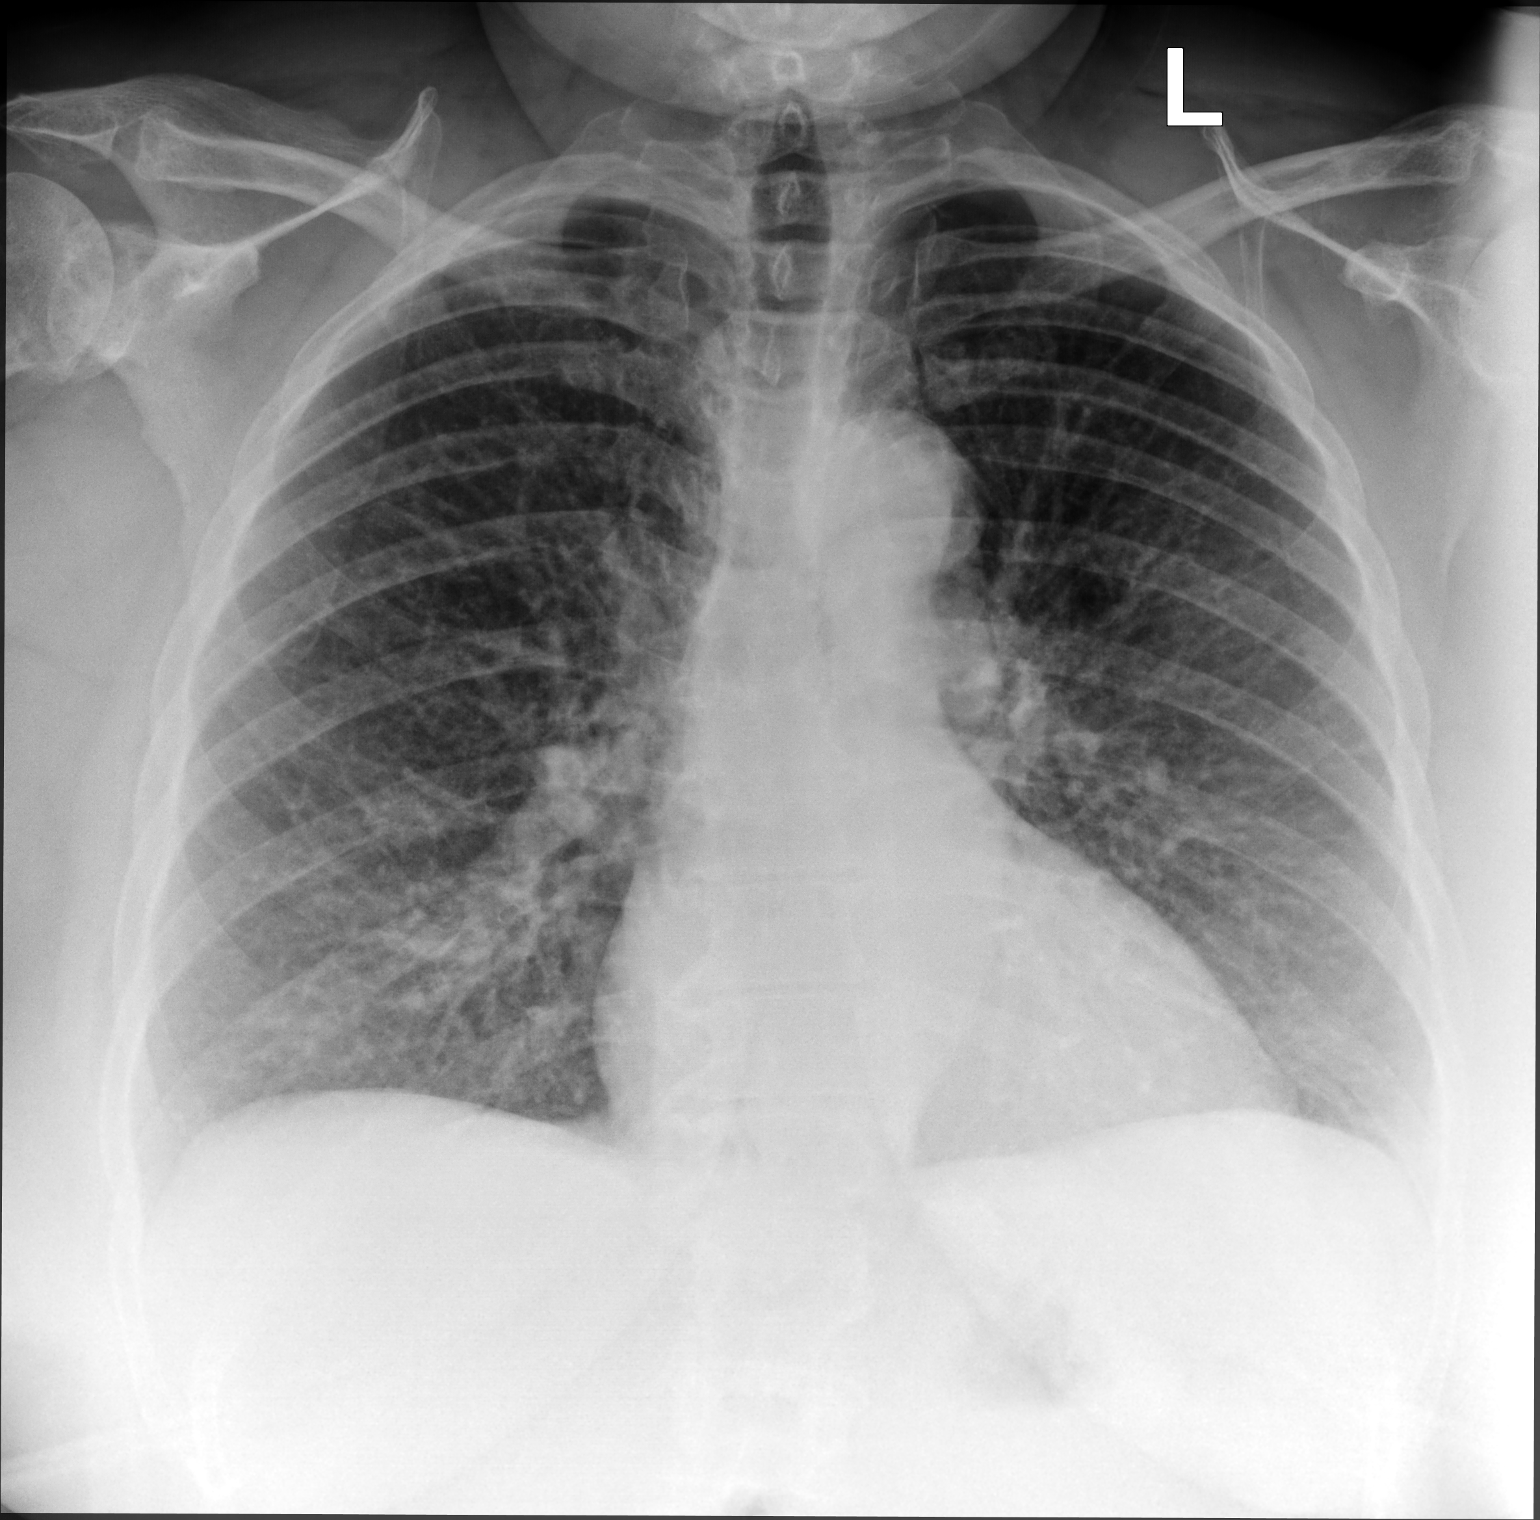

[chest lat]
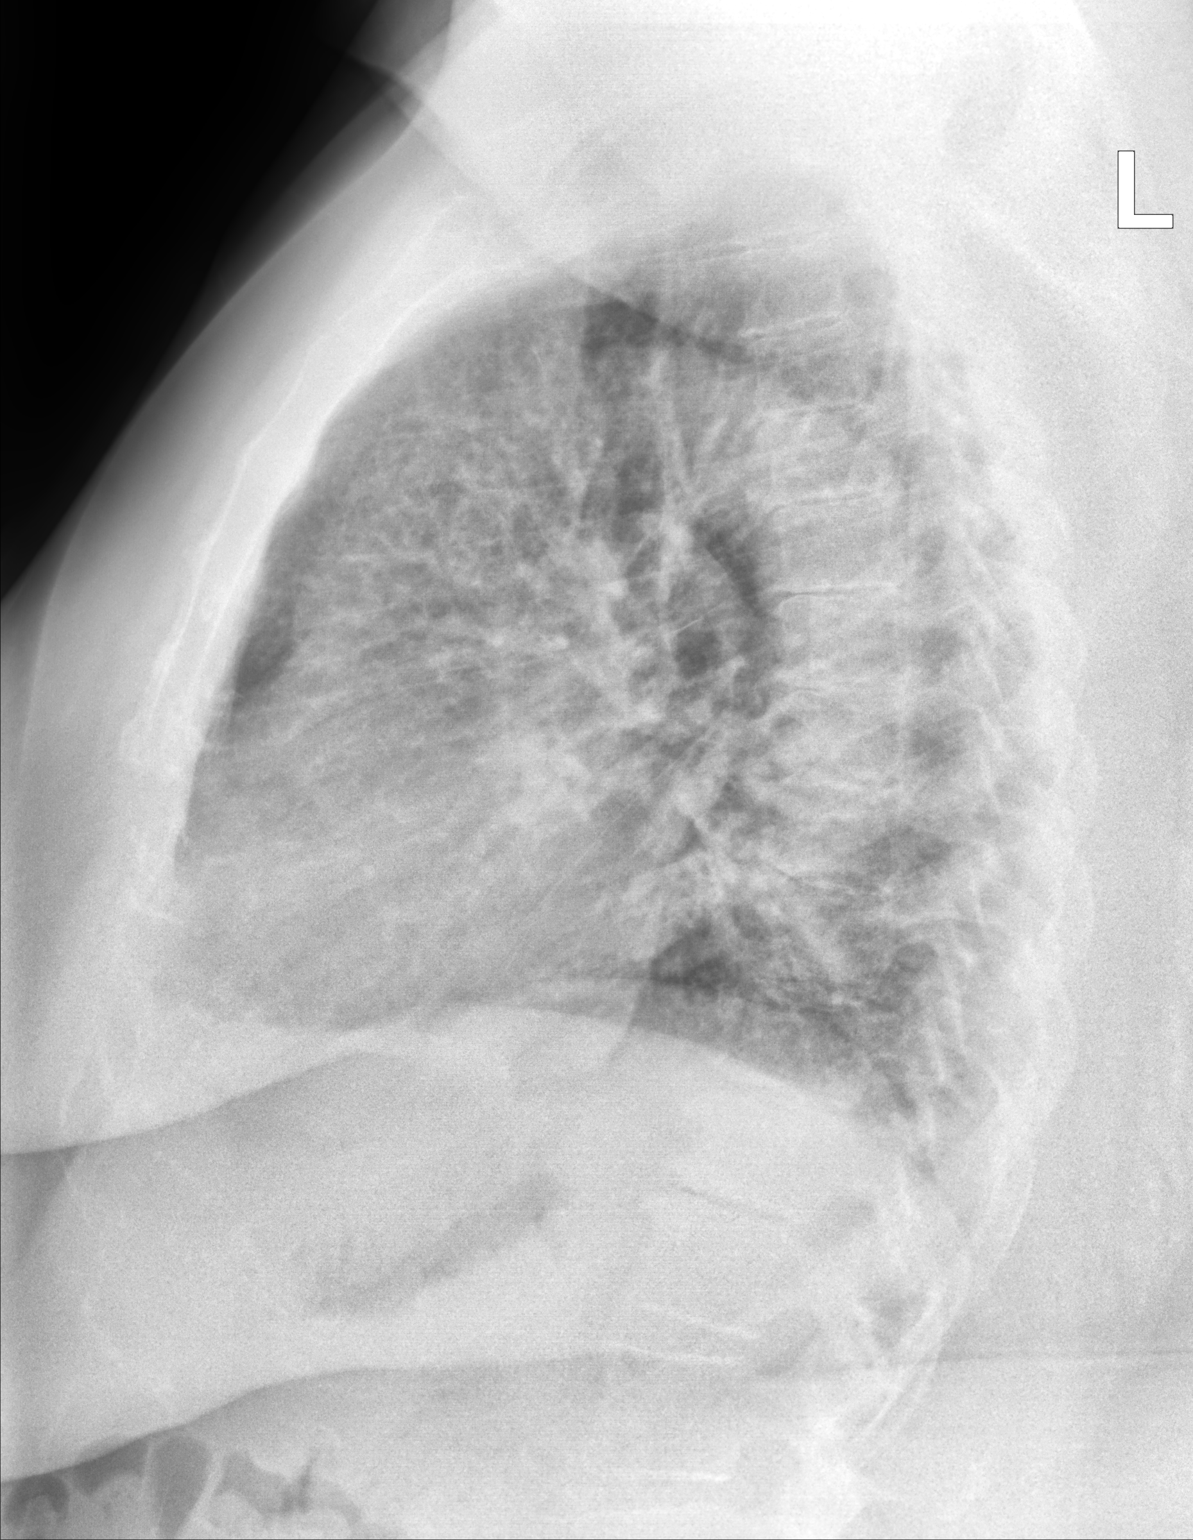

[2 of 2 positions shown; findings below may reference images not displayed]

FINDINGS: The heart is normal in size. Normal mediastinal contours. There is
mild chronic peribronchial thickening. No focal airspace disease. No
pleural fluid. No pneumothorax. No evidence of pulmonary mass.
Degenerative change throughout the thoracic spine and in the right
shoulder.
IMPRESSION: Mild chronic peribronchial thickening, may be asthma or smoking
related. No acute abnormality.

## 2021-08-30 ENCOUNTER — Ambulatory Visit: Payer: Commercial Managed Care - HMO | Admitting: Cardiology

## 2021-10-03 ENCOUNTER — Ambulatory Visit: Payer: Commercial Managed Care - HMO | Admitting: Cardiology

## 2021-10-11 ENCOUNTER — Ambulatory Visit: Payer: Commercial Managed Care - HMO | Admitting: Family Medicine

## 2022-03-31 IMAGING — CR DG LUMBAR SPINE 2-3V
2 series · 2 of 2 positions shown · non-contrast
Comparison: 03/02/2009

CLINICAL DATA: Generalized low back pain for 2 years

EXAM:
LUMBAR SPINE - 2-3 VIEW

[w l-spine a.p. *]
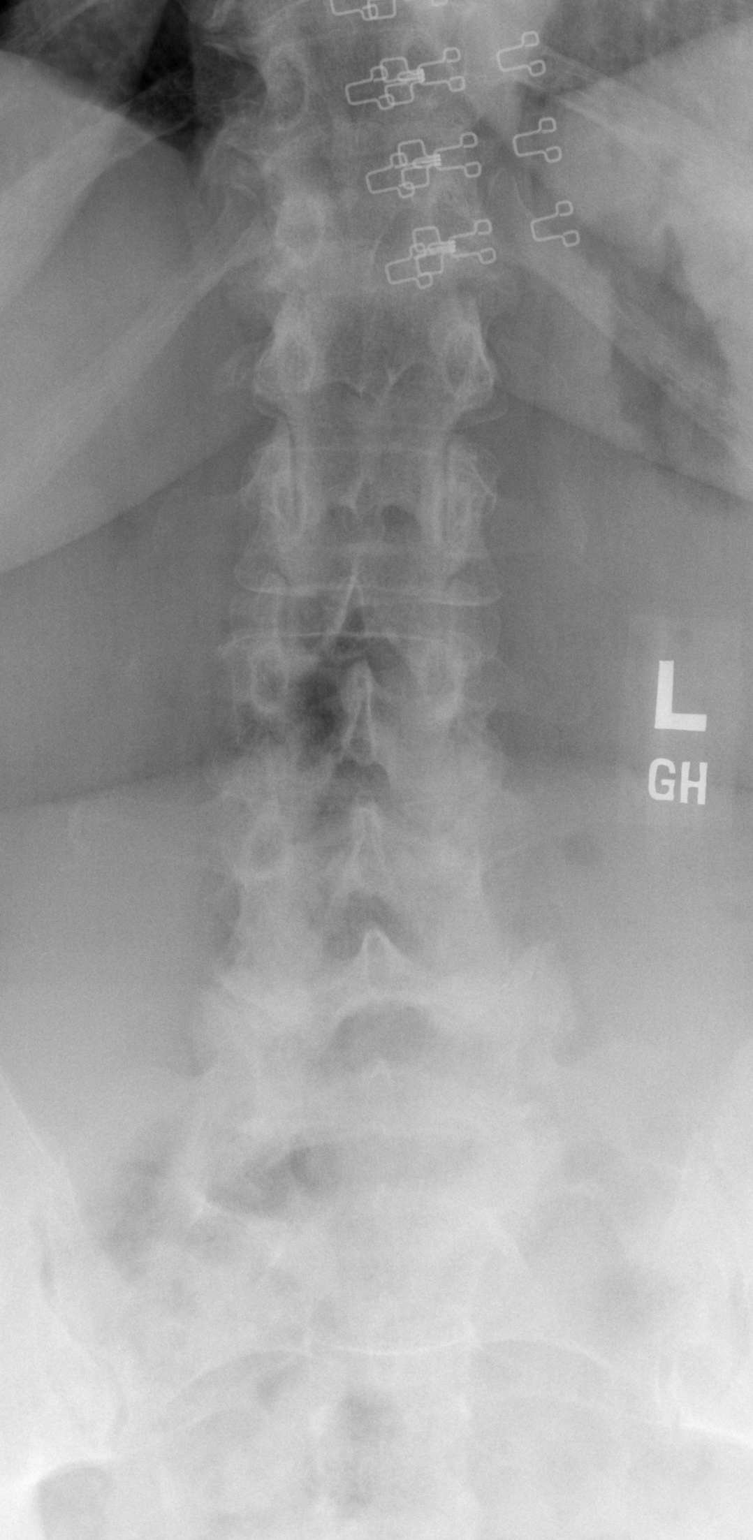

[w l-spine lat *]
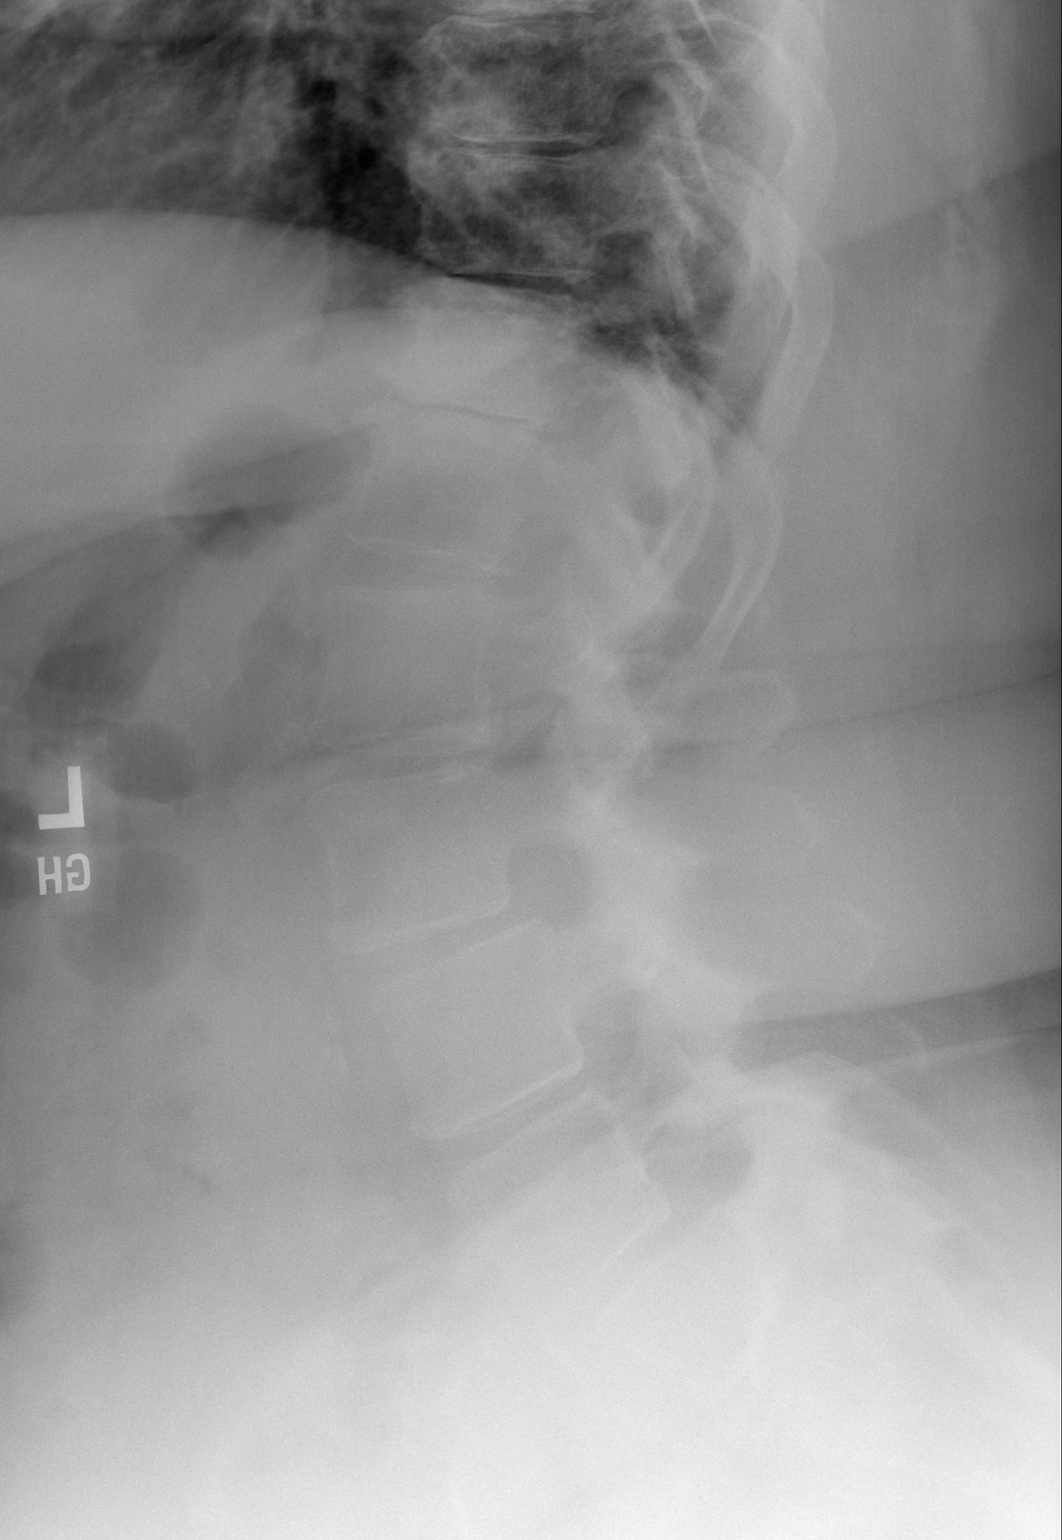

[2 of 2 positions shown; findings below may reference images not displayed]

FINDINGS: Probable rudimentary ribs at the T12 level. 8 mm grade 2
anterolisthesis L5 on S1. 4 mm grade 1 anterolisthesis L3 on L4.
Multilevel intervertebral disc height loss most pronounced at
T12-L1. Lower lumbar facet arthrosis. Scattered abdominal aortic
atherosclerotic calcification.
IMPRESSION: 1. Mild to moderate multilevel spondylosis, progressed from prior.
2. 8 mm grade 2 anterolisthesis L5 on S1 and 4 mm grade 1
anterolisthesis L3 on L4.

## 2022-05-03 ENCOUNTER — Telehealth: Payer: Self-pay | Admitting: Family Medicine

## 2022-05-03 NOTE — Telephone Encounter (Signed)
Pt is wanting to transfer her care from Dr Doreene Burke to Leotis Shames, she is wanting a female provider. Please let me know if this is OK?

## 2022-05-03 NOTE — Telephone Encounter (Signed)
PAPERWORK/FORMS received  Dropped off by: pt Call back #: [phone]  Individual made aware of 3-5 business day turn around (YES/NO): yes GREEN charge sheet completed and patient made aware of possible charge (YES/NO): yes Placed in provider folder at front desk. ~~~ route to CMA/provider Team  CLINICAL USE BELOW THIS LINE (use X to signify action taken)  ___ Form received and placed in providers office for signature. ___ Form completed and faxed to LOA Dept.  ___ Form completed & LVM to notify patient ready for pick up.  ___ Charge sheet and copy of form in front office folder for office supervisor.

## 2022-05-06 NOTE — Telephone Encounter (Signed)
  __X_ Form received and placed in providers office for signature. ___ Form completed and faxed to LOA Dept.  ___ Form completed & LVM to notify patient ready for pick up.  ___ Charge sheet and copy of form in front office folder for office supervisor. 

## 2022-05-10 DIAGNOSIS — Z0289 Encounter for other administrative examinations: Secondary | ICD-10-CM

## 2022-05-10 NOTE — Telephone Encounter (Signed)
I called pt and left a vm that she is able to schedule a TOC with Lauren, both providers agreed.l

## 2022-05-10 NOTE — Telephone Encounter (Signed)
Pt is checking on status of this message. She is needing the type of arthritis she has and the type of medication she was put on for it.

## 2022-05-14 NOTE — Telephone Encounter (Signed)
Pt called to check status of her form.

## 2022-05-15 NOTE — Telephone Encounter (Signed)
LMOM informing Pt that form is ready for pick up at front desk. Copy sent for scanning.  

## 2022-05-21 NOTE — Telephone Encounter (Signed)
Form completed on 05/15/22 per [facility] patient aware that form ready for pick up. Patient came in office states that form was not completed and needed type of arthritis and medications that was prescribed to patient. Form placed back on Providers desk to be filled out. I called patient to inform that I will give patient a call when form is completed. Per patient she will not be able to come by tomorrow morning but if form is completed by the afternoon she will stop by.

## 2022-05-21 NOTE — Telephone Encounter (Signed)
Pt said that she was in the office 05/20/22 and saw Tequila. She left paperwork at front office that needed to be corrected. She said she wrote on the back of the form what needed to be completed. Pt said this is past due by 2 wks. Pt also needs med list and type of arthritis needs to be documented.  Please call back [phone]

## 2022-05-22 NOTE — Telephone Encounter (Signed)
Form completed called patient to inform ready for pick up. No answer LM on identified VM.

## 2022-05-23 ENCOUNTER — Ambulatory Visit: Payer: 59 | Admitting: Family Medicine

## 2022-05-29 ENCOUNTER — Ambulatory Visit (INDEPENDENT_AMBULATORY_CARE_PROVIDER_SITE_OTHER): Payer: 59 | Admitting: Nurse Practitioner

## 2022-05-29 ENCOUNTER — Encounter: Payer: Self-pay | Admitting: Nurse Practitioner

## 2022-05-29 VITALS — BP 132/78 | HR 91 | Temp 98.7°F | Ht 64.0 in | Wt 314.0 lb

## 2022-05-29 DIAGNOSIS — J301 Allergic rhinitis due to pollen: Secondary | ICD-10-CM

## 2022-05-29 DIAGNOSIS — K219 Gastro-esophageal reflux disease without esophagitis: Secondary | ICD-10-CM

## 2022-05-29 DIAGNOSIS — J4521 Mild intermittent asthma with (acute) exacerbation: Secondary | ICD-10-CM

## 2022-05-29 DIAGNOSIS — M2550 Pain in unspecified joint: Secondary | ICD-10-CM

## 2022-05-29 DIAGNOSIS — Z720 Tobacco use: Secondary | ICD-10-CM | POA: Diagnosis not present

## 2022-05-29 DIAGNOSIS — Z6841 Body Mass Index (BMI) 40.0 and over, adult: Secondary | ICD-10-CM

## 2022-05-29 MED ORDER — PREDNISONE 20 MG PO TABS: 40.0000 mg | ORAL_TABLET | Freq: Every day | ORAL | 0 refills | Status: DC

## 2022-05-29 MED ORDER — ALBUTEROL SULFATE HFA 108 (90 BASE) MCG/ACT IN AERS: 1.0000 | INHALATION_SPRAY | Freq: Four times a day (QID) | RESPIRATORY_TRACT | 2 refills | Status: DC | PRN

## 2022-05-29 MED ORDER — PANTOPRAZOLE SODIUM 20 MG PO TBEC
20.0000 mg | DELAYED_RELEASE_TABLET | Freq: Every day | ORAL | 1 refills | Status: DC
Start: 2022-05-29 — End: 2022-09-20

## 2022-05-29 MED ORDER — DOXYCYCLINE HYCLATE 100 MG PO TABS: 100.0000 mg | ORAL_TABLET | Freq: Two times a day (BID) | ORAL | 0 refills | Status: DC

## 2022-05-29 MED ORDER — DULOXETINE HCL 30 MG PO CPEP: 30.0000 mg | ORAL_CAPSULE | Freq: Every day | ORAL | 0 refills | Status: DC

## 2022-05-29 MED ORDER — FLUTICASONE PROPIONATE 50 MCG/ACT NA SUSP
NASAL | 6 refills | Status: DC
Start: 2022-05-29 — End: 2023-04-03

## 2022-05-29 MED ORDER — GABAPENTIN 300 MG PO CAPS
300.0000 mg | ORAL_CAPSULE | Freq: Two times a day (BID) | ORAL | 1 refills | Status: DC
Start: 2022-05-29 — End: 2022-06-27

## 2022-05-29 NOTE — Progress Notes (Signed)
New Patient Visit  BP 132/78 (BP Location: Right Arm)   Pulse 91   Temp 98.7 F (37.1 C) (Oral)   Ht 5\' 4"  (1.626 m)   Wt (!) 314 lb (142.4 kg)   SpO2 93%   BMI 53.90 kg/m    Subjective:    Patient ID: Sandra Stafford, female    DOB: 1960/10/25, 62 y.o.   MRN: [id number]  CC: Chief Complaint  Patient presents with   Transfer of Care    Head and chest congestion, coughing, bilateral ear pain, SOB,fatigue, swelling in legs and feet    HPI: Sandra Stafford is a 62 y.o. female presents to transfer care to a new provider.  Introduced to Publishing rights manager role and practice setting.  All questions answered.  Discussed provider/patient relationship and expectations.  She has been experiencing shortness of breath with exertion. She states that she will need to take a break after walking. She has also been experiencing congestion which comes and goes especially with the weather changing. She has been taking mucinex which helps slightly, but she is still having ongoing congestion. This is associated with wheezing. She also used her nephews albuterol inhaler. She denies fevers and chest pain.  She has a history of GERD and was taking Protonix 20 mg daily.  She has been out of this medication and needs a refill of this.  She states that when she eats sweet foods that makes the pain worse.  She has a history of arthritis.  She was taking duloxetine 60 mg twice a day and gabapentin 300 mg 3 times a day, however has not been out of her medications for several months.  Recently, she has been taking Tylenol as needed for pain.  She states that she has arthritis in her back, knees, hips.  Depression and Anxiety Screen done:     05/29/2022    1:28 PM 04/23/2021    1:38 PM 04/09/2021    1:42 PM 12/25/2020    2:10 PM 10/26/2020    4:04 PM  Depression screen PHQ 2/9  Decreased Interest 1 0 0 0 0  Down, Depressed, Hopeless 0 0 0 0 0  PHQ - 2 Score 1 0 0 0 0  Altered sleeping 3      Tired, decreased  energy 3      Change in appetite 1      Feeling bad or failure about yourself  0      Trouble concentrating 1      Moving slowly or fidgety/restless 0      Suicidal thoughts 0      PHQ-9 Score 9          05/29/2022    1:30 PM 06/16/2020   10:16 AM 11/22/2019   11:30 AM 07/05/2019    1:51 PM  GAD 7 : Generalized Anxiety Score  Nervous, Anxious, on Edge 2 2 1 1   Control/stop worrying 3 2 2 1   Worry too much - different things 3 2 2 1   Trouble relaxing 2 2 2 1   Restless 0 2 2 1   Easily annoyed or irritable 3 2 1 1   Afraid - awful might happen 1 2 1 1   Total GAD 7 Score 14 14 11 7   Anxiety Difficulty  Somewhat difficult Very difficult Somewhat difficult   Past Medical History:  Diagnosis Date   Abnormal Pap smear of cervix    ALLERGIC RHINITIS 11/03/2006   Qualifier: Diagnosis of  By: Abby Potash  Allergy    Anxiety    Aortic stenosis mild, based on echo from 2021 10/01/2019   Arthritis    Asthma    CARCINOMA, VULVA 11/03/2006   Qualifier: Diagnosis of  By: Abby Potash     Class 3 severe obesity due to excess calories with body mass index (BMI) of 40.0 to 44.9 in adult (HCC) 11/03/2006   Qualifier: Diagnosis of  By: Abby Potash     Depression    Depression with anxiety 06/02/2019   DOE (dyspnea on exertion) 08/04/2019   DYSPEPSIA 11/03/2006   Qualifier: History of  By: Abby Potash     Fibroid    Genital warts    GERD (gastroesophageal reflux disease)    Healthcare maintenance 05/17/2019   Heart murmur 07/05/2019   Medication side effect 10/20/2019   Muscle cramps 06/02/2019   Need for 23-polyvalent pneumococcal polysaccharide vaccine 10/20/2019   Need for influenza vaccination 10/20/2019   Polyarthralgia 05/17/2019   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HCC) 05/17/2019   PUD 11/03/2006   Qualifier: Diagnosis of  By: Abby Potash     Seasonal allergic rhinitis due to pollen 06/02/2019   STD (sexually transmitted disease)    hpv (genital warts   Stress incontinence  11/22/2019   Tobacco use 11/03/2006   Qualifier: Diagnosis of  By: Abby Potash     VENEREAL WART 11/03/2006   Qualifier: Diagnosis of  By: Abby Potash     Vitamin D deficiency 05/22/2019    Past Surgical History:  Procedure Laterality Date   ABDOMINAL HYSTERECTOMY     CERVIX LESION DESTRUCTION     CESAREAN SECTION     SALPINGECTOMY      Family History  Problem Relation Age of Onset   Cancer Mother        unsure type   Diabetes Mother    Hypertension Mother    Diabetes Father    Colon cancer Cousin      Social History   Tobacco Use   Smoking status: Every Day    Packs/day: 0.25    Years: 50.00    Additional pack years: 0.00    Total pack years: 12.50    Types: Cigarettes   Smokeless tobacco: Never  Vaping Use   Vaping Use: Never used  Substance Use Topics   Alcohol use: Not Currently   Drug use: No    Current Outpatient Medications on File Prior to Visit  Medication Sig Dispense Refill   Acetaminophen (TYLENOL ARTHRITIS PAIN PO) Take by mouth. (Patient not taking: Reported on 05/29/2022)     No current facility-administered medications on file prior to visit.     Review of Systems  Constitutional:  Positive for fatigue. Negative for fever.  HENT:  Positive for congestion and ear pain. Negative for sore throat.   Eyes: Negative.   Respiratory:  Positive for cough, shortness of breath and wheezing.   Cardiovascular:  Positive for leg swelling. Negative for chest pain and palpitations.  Gastrointestinal: Negative.   Genitourinary: Negative.   Musculoskeletal:  Positive for arthralgias and back pain.  Skin: Negative.   Neurological: Negative.   Psychiatric/Behavioral: Negative.        Objective:    BP 132/78 (BP Location: Right Arm)   Pulse 91   Temp 98.7 F (37.1 C) (Oral)   Ht 5\' 4"  (1.626 m)   Wt (!) 314 lb (142.4 kg)   SpO2 93%   BMI 53.90 kg/m   Wt Readings from Last 3 Encounters:  05/29/22 (!) 314 lb (  142.4 kg)  07/10/21 (!) 303 lb  (137.4 kg)  04/23/21 (!) 301 lb (136.5 kg)    BP Readings from Last 3 Encounters:  05/29/22 132/78  07/10/21 122/88  04/23/21 128/70    Physical Exam Vitals and nursing note reviewed.  Constitutional:      General: She is not in acute distress.    Appearance: Normal appearance.  HENT:     Head: Normocephalic and atraumatic.     Right Ear: Tympanic membrane, ear canal and external ear normal.     Left Ear: Tympanic membrane, ear canal and external ear normal.  Eyes:     Conjunctiva/sclera: Conjunctivae normal.  Cardiovascular:     Rate and Rhythm: Normal rate and regular rhythm.     Pulses: Normal pulses.     Heart sounds: Murmur heard.  Pulmonary:     Effort: Pulmonary effort is normal.     Breath sounds: Wheezing and rhonchi present.  Abdominal:     Palpations: Abdomen is soft.     Tenderness: There is no abdominal tenderness.  Musculoskeletal:        General: Normal range of motion.     Cervical back: Normal range of motion and neck supple.     Right lower leg: No edema.     Left lower leg: No edema.  Lymphadenopathy:     Cervical: No cervical adenopathy.  Skin:    General: Skin is warm and dry.  Neurological:     General: No focal deficit present.     Mental Status: She is alert and oriented to person, place, and time.     Cranial Nerves: No cranial nerve deficit.     Coordination: Coordination normal.     Gait: Gait normal.  Psychiatric:        Mood and Affect: Mood normal.        Behavior: Behavior normal.        Thought Content: Thought content normal.        Judgment: Judgment normal.        Assessment & Plan:   Problem List Items Addressed This Visit       Respiratory   Seasonal allergic rhinitis due to pollen    Chronic, ongoing.  She is currently having an exacerbation due to the pollen.  Will refill her Flonase nasal spray to take daily.  Continue albuterol inhaler as needed.  Will give a prednisone course 40 mg daily for 5 days.  Follow-up in 4  weeks or sooner if symptoms worsen.      Relevant Medications   fluticasone (FLONASE) 50 MCG/ACT nasal spray   Reactive airway disease    Chronic, exacerbated.  She is having recurrent exacerbation due to the pollen.  Will have her start prednisone 40 mg x 5 days, refill albuterol inhaler, start Flonase nasal spray daily.  With smoking, will also start her on doxycycline 100 mg twice a day for 10 days.  Encourage fluids, rest.  Follow-up in 4 weeks or sooner with concerns.  She has been having ongoing shortness of breath, especially with exertion.  Will evaluate after treating acute exacerbation.  May need to be on a maintenance inhaler and consider referral to pulmonology.      Relevant Medications   albuterol (VENTOLIN HFA) 108 (90 Base) MCG/ACT inhaler     Digestive   GERD (gastroesophageal reflux disease)    Chronic, ongoing.  Will refill Protonix 20 mg daily.      Relevant Medications   pantoprazole (  PROTONIX) 20 MG tablet     Other   Morbid obesity (HCC)    BMI 53.9.  Discussed nutrition, exercise.      Tobacco use - Primary    She is currently smoking a third of a pack a day.  Recommend complete tobacco cessation.      Arthralgia    Chronic, not controlled.  She has a history of arthralgia in multiple joints.  Will restart her duloxetine 30 mg daily and gabapentin 300 mg at bedtime for 7 days then increase to twice a day.  May need to increase both of these but see her response to these first.  She has been off these medications for several months.  Follow-up 4 weeks.      Relevant Medications   gabapentin (NEURONTIN) 300 MG capsule     Follow up plan: Return in about 4 weeks (around 06/26/2022) for CPE.

## 2022-05-29 NOTE — Assessment & Plan Note (Signed)
She is currently smoking a third of a pack a day.  Recommend complete tobacco cessation.

## 2022-05-29 NOTE — Assessment & Plan Note (Signed)
Chronic, not controlled.  She has a history of arthralgia in multiple joints.  Will restart her duloxetine 30 mg daily and gabapentin 300 mg at bedtime for 7 days then increase to twice a day.  May need to increase both of these but see her response to these first.  She has been off these medications for several months.  Follow-up 4 weeks.

## 2022-05-29 NOTE — Patient Instructions (Signed)
It was great to see you!  Start doxycycline 1 tablet twice a day for 10 days with food.   Start prednisone 2 tablets once a day with food in the morning.  I have refilled your albuterol inhaler to use every 4 hours as needed for shortness of breath.   Start duloxetine 1 capsule daily and gabapentin 1 capsule daily for your pain, Keep taking tylenol as needed.  Start protonix once a day.   Let's follow-up in 4 weeks, sooner if you have concerns.  If a referral was placed today, you will be contacted for an appointment. Please note that routine referrals can sometimes take up to 3-4 weeks to process. Please call our office if you haven't heard anything after this time frame.  Take care,  Rodman Pickle, NP

## 2022-05-29 NOTE — Assessment & Plan Note (Signed)
BMI 53.9.  Discussed nutrition, exercise.

## 2022-05-29 NOTE — Assessment & Plan Note (Signed)
Chronic, ongoing.  Will refill Protonix 20 mg daily.

## 2022-05-29 NOTE — Assessment & Plan Note (Signed)
Chronic, ongoing.  She is currently having an exacerbation due to the pollen.  Will refill her Flonase nasal spray to take daily.  Continue albuterol inhaler as needed.  Will give a prednisone course 40 mg daily for 5 days.  Follow-up in 4 weeks or sooner if symptoms worsen.

## 2022-05-29 NOTE — Assessment & Plan Note (Signed)
Chronic, exacerbated.  She is having recurrent exacerbation due to the pollen.  Will have her start prednisone 40 mg x 5 days, refill albuterol inhaler, start Flonase nasal spray daily.  With smoking, will also start her on doxycycline 100 mg twice a day for 10 days.  Encourage fluids, rest.  Follow-up in 4 weeks or sooner with concerns.  She has been having ongoing shortness of breath, especially with exertion.  Will evaluate after treating acute exacerbation.  May need to be on a maintenance inhaler and consider referral to pulmonology.

## 2022-06-27 ENCOUNTER — Encounter: Payer: Self-pay | Admitting: Nurse Practitioner

## 2022-06-27 ENCOUNTER — Ambulatory Visit (INDEPENDENT_AMBULATORY_CARE_PROVIDER_SITE_OTHER): Payer: 59 | Admitting: Nurse Practitioner

## 2022-06-27 ENCOUNTER — Telehealth: Payer: Self-pay

## 2022-06-27 ENCOUNTER — Other Ambulatory Visit (HOSPITAL_COMMUNITY): Payer: Self-pay

## 2022-06-27 VITALS — BP 138/88 | HR 82 | Temp 97.3°F | Ht 64.0 in | Wt 311.2 lb

## 2022-06-27 DIAGNOSIS — R600 Localized edema: Secondary | ICD-10-CM | POA: Diagnosis not present

## 2022-06-27 DIAGNOSIS — M2550 Pain in unspecified joint: Secondary | ICD-10-CM

## 2022-06-27 DIAGNOSIS — J4520 Mild intermittent asthma, uncomplicated: Secondary | ICD-10-CM

## 2022-06-27 DIAGNOSIS — M545 Low back pain, unspecified: Secondary | ICD-10-CM

## 2022-06-27 DIAGNOSIS — Z131 Encounter for screening for diabetes mellitus: Secondary | ICD-10-CM | POA: Diagnosis not present

## 2022-06-27 DIAGNOSIS — Z13220 Encounter for screening for lipoid disorders: Secondary | ICD-10-CM

## 2022-06-27 DIAGNOSIS — G8929 Other chronic pain: Secondary | ICD-10-CM

## 2022-06-27 LAB — CBC WITH DIFFERENTIAL/PLATELET
Basophils Absolute: 0.1 10*3/uL (ref 0.0–0.1)
Basophils Relative: 1.1 % (ref 0.0–3.0)
Eosinophils Absolute: 0.3 10*3/uL (ref 0.0–0.7)
Eosinophils Relative: 3.9 % (ref 0.0–5.0)
HCT: 47.8 % — ABNORMAL HIGH (ref 36.0–46.0)
Hemoglobin: 15.1 g/dL — ABNORMAL HIGH (ref 12.0–15.0)
Lymphocytes Relative: 19.3 % (ref 12.0–46.0)
Lymphs Abs: 1.4 10*3/uL (ref 0.7–4.0)
MCHC: 31.5 g/dL (ref 30.0–36.0)
MCV: 91.5 fl (ref 78.0–100.0)
Monocytes Absolute: 0.5 10*3/uL (ref 0.1–1.0)
Monocytes Relative: 7.4 % (ref 3.0–12.0)
Neutro Abs: 4.9 10*3/uL (ref 1.4–7.7)
Neutrophils Relative %: 68.3 % (ref 43.0–77.0)
Platelets: 204 10*3/uL (ref 150.0–400.0)
RBC: 5.23 Mil/uL — ABNORMAL HIGH (ref 3.87–5.11)
RDW: 13.8 % (ref 11.5–15.5)
WBC: 7.1 10*3/uL (ref 4.0–10.5)

## 2022-06-27 LAB — COMPREHENSIVE METABOLIC PANEL
ALT: 19 U/L (ref 0–35)
AST: 16 U/L (ref 0–37)
Albumin: 4.2 g/dL (ref 3.5–5.2)
Alkaline Phosphatase: 70 U/L (ref 39–117)
BUN: 8 mg/dL (ref 6–23)
CO2: 29 mEq/L (ref 19–32)
Calcium: 9.3 mg/dL (ref 8.4–10.5)
Chloride: 102 mEq/L (ref 96–112)
Creatinine, Ser: 0.64 mg/dL (ref 0.40–1.20)
GFR: 94.73 mL/min (ref >60.00)
Glucose, Bld: 98 mg/dL (ref 70–99)
Potassium: 4.3 mEq/L (ref 3.5–5.1)
Sodium: 140 mEq/L (ref 135–145)
Total Bilirubin: 0.4 mg/dL (ref 0.2–1.2)
Total Protein: 7 g/dL (ref 6.0–8.3)

## 2022-06-27 LAB — HEMOGLOBIN A1C: Hgb A1c MFr Bld: 6.5 % (ref 4.6–6.5)

## 2022-06-27 LAB — LIPID PANEL
Cholesterol: 177 mg/dL (ref 0–200)
HDL: 64.6 mg/dL (ref >39.00)
LDL Cholesterol: 83 mg/dL (ref 0–99)
NonHDL: 112.33
Total CHOL/HDL Ratio: 3
Triglycerides: 148 mg/dL (ref 0.0–149.0)
VLDL: 29.6 mg/dL (ref 0.0–40.0)

## 2022-06-27 LAB — BRAIN NATRIURETIC PEPTIDE: Pro B Natriuretic peptide (BNP): 31 pg/mL (ref 0.0–100.0)

## 2022-06-27 MED ORDER — FLUTICASONE-SALMETEROL 100-50 MCG/ACT IN AEPB
1.0000 | INHALATION_SPRAY | Freq: Two times a day (BID) | RESPIRATORY_TRACT | 3 refills | Status: DC
Start: 2022-06-27 — End: 2023-04-03

## 2022-06-27 MED ORDER — PREGABALIN 75 MG PO CAPS: 75.0000 mg | ORAL_CAPSULE | Freq: Every day | ORAL | 1 refills | Status: DC

## 2022-06-27 NOTE — Patient Instructions (Signed)
It was great to see you!  Stop the duloxetine (cymbalta) and gabapentin. Start lyrica 1 capsule daily.   Start advair inhaler 1 puff twice a day every day to help with your breathing. You should rinse your mouth out after using the inhaler or brush your teeth. Keep using your albuterol inhaler as needed, but hopefully you will need to use this less. Take mucinex as needed to break up the congestion.   We are checking your labs today and will let you know the results via mychart/phone.   Let's follow-up in 4 weks, sooner if you have concerns.  If a referral was placed today, you will be contacted for an appointment. Please note that routine referrals can sometimes take up to 3-4 weeks to process. Please call our office if you haven't heard anything after this time frame.  Take care,  Rodman Pickle, NP

## 2022-06-27 NOTE — Assessment & Plan Note (Addendum)
BMI 53.42. Screen for A1C and lipid panel today.

## 2022-06-27 NOTE — Assessment & Plan Note (Addendum)
Chronic, uncontrolled, complaints of exacerbation of shortness of breath with wheezing. Did not express a trigger. Uses albuterol inhaler twice a day occasionally three times to manage shortness of breath and wheezing. Congested with clear productive cough. Prescribed advair inhaler to use 2 times a day for shortness of breath. Rinse mouth out after using and continue albuterol inhaler as needed. Follow-up if symptoms worsen or do not improve.

## 2022-06-27 NOTE — Progress Notes (Signed)
Acute Office Visit  Subjective:     Patient ID: Sandra Stafford, female    DOB: [birth year], 62 y.o.   MRN: [id number]  Chief Complaint  Patient presents with   Back Pain    Concerns with legs swelling and burning in legs and feet, lower back pain, SOB, Congestion   HPI: Patient is in today for concerns with back pain, leg swelling, burning in legs and feet, SOB, and congestion.  Patient states the leg swelling, burning in legs and feet has been going for more than a year. The pain is constant and standing in one position is difficult. Moving around helps with the discomfort but not much. States was taking Omega XL and it was helping. She states before taking the Omega XL she was unable put on her sandals with them being very tight. She had to let them out all the way so they would not leave indentation in feet. Have not been able to keep taking this medication because it is too expensive. She expresses the pain in her legs and feet are moderate to severe constantly. She takes tylenol which helps some. She adds that she has numbness in feet and hands that come and go. She has difficulties with doing tasks like grocery shopping and carrying bags due to the tingling and numbness in hands bilaterally.  Patient continues to have lower back pain despite taking the cymbalta and gabapentin. She states the cymbalta is causing her to have headaches however she is still taking it as prescribed. Patient states the gabapentin is not helping and shares that she was on highest does of gabapentin in the past and it was not effective either.    Patient states shortness of breath exacerbation started about 2 days ago. Patient states she has to sleep with a fan in addition to A/C being on to support her breathing. She states she takes her albuterol medication twice a day and occasionally three times when the shortness breath worsens. With her shortness of breath she states she is congested with clear sputum and has  noted audible wheezing when she breathes. She has not taken any mucinex because she knew she had a doctors appointment. She states she continues to smoke 1-2 cigarettes per day.   Review of Systems  Constitutional:  Positive for diaphoresis.  Eyes:  Positive for blurred vision.       Seeing spots ans shadows   Respiratory:  Positive for cough, sputum production, shortness of breath and wheezing.   Cardiovascular:  Positive for leg swelling. Negative for chest pain and palpitations.  Gastrointestinal: Negative.   Genitourinary: Negative.   Musculoskeletal:  Positive for back pain and joint pain.  Skin: Negative.   Neurological:  Positive for headaches.        Objective:    BP 138/88 (BP Location: Right Arm)   Pulse 82   Temp (!) 97.3 F (36.3 C)   Ht 5\' 4"  (1.626 m)   Wt (!) 311 lb 3.2 oz (141.2 kg)   SpO2 90%   BMI 53.42 kg/m    Physical Exam Constitutional:      Appearance: Normal appearance. She is obese.  HENT:     Head: Normocephalic and atraumatic.  Cardiovascular:     Rate and Rhythm: Normal rate and regular rhythm.     Pulses: Normal pulses.     Heart sounds: Normal heart sounds. No murmur heard.    No gallop.  Pulmonary:     Breath sounds: Wheezing  present.     Comments: Mild shortness of breath on exertion (noted with speaking) Musculoskeletal:        General: Swelling present.     Right lower leg: Edema present.     Left lower leg: Edema present.     Comments: Non pitting  Skin:    General: Skin is warm and dry.  Neurological:     Mental Status: She is alert and oriented to person, place, and time.     Motor: Weakness present.  Psychiatric:        Mood and Affect: Mood normal.        Behavior: Behavior normal.        Thought Content: Thought content normal.        Judgment: Judgment normal.     No results found for any visits on 06/27/22.      Assessment & Plan:   Problem List Items Addressed This Visit       Respiratory   Reactive  airway disease    Chronic, uncontrolled, complaints of exacerbation of shortness of breath with wheezing. Did not express a trigger. Uses albuterol inhaler twice a day occasionally three times to manage shortness of breath and wheezing. Congested with clear productive cough. Prescribed advair inhaler to use 2 times a day for shortness of breath. Rinse mouth out after using and continue albuterol inhaler as needed. Follow-up if symptoms worsen or do not improve.         Other   Morbid obesity (HCC)    BMI 53.42. Screen for A1C and lipid panel today.      Arthralgia    Chronic (uncontrolled) joint pain in legs, feet, and back. Patient states that cymbalta is causing her  to have headaches and gabapentin is not working. Discontinued cymbalta and gabapentin. Prescribed lyrica 75 mg daily for joint pain. Continue to take tylenol to help arthritic pain. Will follow up in 4 weeks. Check ANA and rheumatoid factor today.       Relevant Orders   ANA w/Reflex   Rheumatoid Factor   Chronic midline low back pain    Chronic (uncontrolled) lower back pain. She has a history of arthralgia in multiple joints. Started taking Cymbalta and Gabapentin but ineffective. Discontinued. Prescribed Lyrica 75 mg daily.   Follow-up in 4 weeks.       Relevant Medications   pregabalin (LYRICA) 75 MG capsule   Other Visit Diagnoses     Screening, lipid    -  Primary   Screen lipd panel today.   Relevant Orders   Lipid panel   Screening for diabetes mellitus       Check A1C today.   Relevant Orders   Hemoglobin A1c   Edema of both legs       Discussed wearing compression socks. Check BNP today.   Relevant Orders   CBC with Differential/Platelet   Comprehensive metabolic panel   B Nat Peptide       Meds ordered this encounter  Medications   fluticasone-salmeterol (ADVAIR) 100-50 MCG/ACT AEPB    Sig: Inhale 1 puff into the lungs 2 (two) times daily.    Dispense:  1 each    Refill:  3   pregabalin  (LYRICA) 75 MG capsule    Sig: Take 1 capsule (75 mg total) by mouth daily.    Dispense:  30 capsule    Refill:  1    Return in about 4 weeks (around 07/25/2022) for back pain, shortness of breath.  Bishop Dublin, RN

## 2022-06-27 NOTE — Telephone Encounter (Signed)
Pharmacy Patient Advocate Encounter   Received notification from [facility] that prior authorization for Pregabalin 75MG  capsules is required/requested.   PA submitted to [facility] via CoverMyMeds Key or (Medicaid) confirmation # [id number] Status is pending

## 2022-06-27 NOTE — Assessment & Plan Note (Addendum)
Chronic (uncontrolled) lower back pain. She has a history of arthralgia in multiple joints. Started taking Cymbalta and Gabapentin but ineffective. Discontinued. Prescribed Lyrica 75 mg daily.   Follow-up in 4 weeks.

## 2022-06-27 NOTE — Assessment & Plan Note (Addendum)
Chronic (uncontrolled) joint pain in legs, feet, and back. Patient states that cymbalta is causing her  to have headaches and gabapentin is not working. Discontinued cymbalta and gabapentin. Prescribed lyrica 75 mg daily for joint pain. Continue to take tylenol to help arthritic pain. Will follow up in 4 weeks. Check ANA and rheumatoid factor today.

## 2022-06-28 ENCOUNTER — Other Ambulatory Visit (HOSPITAL_COMMUNITY): Payer: Self-pay

## 2022-06-28 LAB — RHEUMATOID FACTOR: Rheumatoid fact SerPl-aCnc: <10 IU/mL (ref <14)

## 2022-06-28 LAB — ANA W/REFLEX: Anti Nuclear Antibody (ANA): NEGATIVE

## 2022-06-28 NOTE — Telephone Encounter (Signed)
Patient notified that Rx was approved.

## 2022-06-28 NOTE — Telephone Encounter (Signed)
Patient Advocate Encounter  Prior Authorization for Pregabalin 75MG  capsules has been approved with [employer].    PA# [id number] Effective dates: 06/27/22 through 06/27/23  Per [facility] test claim, copay for 30 days supply is $2.21

## 2022-08-02 ENCOUNTER — Ambulatory Visit: Payer: Self-pay | Admitting: Nurse Practitioner

## 2022-09-20 ENCOUNTER — Other Ambulatory Visit: Payer: Self-pay

## 2022-09-20 DIAGNOSIS — K219 Gastro-esophageal reflux disease without esophagitis: Secondary | ICD-10-CM

## 2022-09-20 MED ORDER — PANTOPRAZOLE SODIUM 20 MG PO TBEC
20.0000 mg | DELAYED_RELEASE_TABLET | Freq: Every day | ORAL | 0 refills | Status: DC
Start: 2022-09-20 — End: 2023-01-20

## 2022-09-20 NOTE — Telephone Encounter (Signed)
Requesting: Pantoprazole sodium DR Tabs 20mg  Last Visit: 06/27/2022 Next Visit: Visit date not found Last Refill: 05/29/2022  Please Advise

## 2022-10-04 ENCOUNTER — Encounter: Payer: Self-pay | Admitting: Nurse Practitioner

## 2022-10-04 ENCOUNTER — Ambulatory Visit (INDEPENDENT_AMBULATORY_CARE_PROVIDER_SITE_OTHER): Payer: Commercial Managed Care - HMO | Admitting: Nurse Practitioner

## 2022-10-04 VITALS — BP 122/80 | HR 85 | Temp 97.4°F | Ht 64.0 in | Wt 310.0 lb

## 2022-10-04 DIAGNOSIS — R7303 Prediabetes: Secondary | ICD-10-CM | POA: Diagnosis not present

## 2022-10-04 DIAGNOSIS — G8929 Other chronic pain: Secondary | ICD-10-CM

## 2022-10-04 DIAGNOSIS — J4520 Mild intermittent asthma, uncomplicated: Secondary | ICD-10-CM

## 2022-10-04 DIAGNOSIS — Z23 Encounter for immunization: Secondary | ICD-10-CM

## 2022-10-04 DIAGNOSIS — M545 Low back pain, unspecified: Secondary | ICD-10-CM | POA: Diagnosis not present

## 2022-10-04 LAB — POCT GLYCOSYLATED HEMOGLOBIN (HGB A1C)
HbA1c POC (<> result, manual entry): 6.2 % (ref 4.0–5.6)
HbA1c, POC (controlled diabetic range): 6.2 % (ref 0.0–7.0)
HbA1c, POC (prediabetic range): 6.2 % (ref 5.7–6.4)
Hemoglobin A1C: 6.2 % — AB (ref 4.0–5.6)

## 2022-10-04 NOTE — Assessment & Plan Note (Signed)
Chronic, not controlled.  She did not pick up the Lyrica as she forgot that this medication was switched.  Encouraged her to pick this up from the pharmacy as this will most likely help her feet pain that she is having as well.  Follow-up in 3 months or sooner with concerns.

## 2022-10-04 NOTE — Assessment & Plan Note (Signed)
Chronic, stable.  She states that her breathing has gotten better since starting the Advair inhaler.  She can continue this 1-2 times a day and rinse out her mouth after using.  Consider checking spirometry in the future if symptoms worsen again.

## 2022-10-04 NOTE — Assessment & Plan Note (Signed)
Chronic, stable.  A1c today is 6.2%.  She has been adjusting her diet and has lost 4 pounds since her last visit.  Encouraged her to continue focusing on nutrition.

## 2022-10-04 NOTE — Progress Notes (Signed)
Established Patient Office Visit  Subjective   Patient ID: Sandra Stafford, female    DOB: Jul 27, 1960  Age: 62 y.o. MRN: [id number]  Chief Complaint  Patient presents with   Pain    Lower back pain, Bilateral foot pain, SOB, request shingles and flu vaccine    HPI  Sandra Stafford is here to follow-up on shortness of breath, lower back pain and is also having bilateral foot pain.  She states that her breathing has gotten a lot better since starting the Advair.  She is only taking it once a day as she does not like the feeling in her mouth afterwards.  She does wash her mouth out or brush her teeth afterwards.  She states that she is still having some slight shortness of breath when walking longer distances, however she feels this may be partly due to her weight.  She notes that she is still having ongoing back pain and her feet have been hurting more over the last few months.  She states that it varies where they hurt, it feels like she needs to shake her foot or stop it to get rid of the pain.  She states that she did not pick up the Lyrica as she forgot that it was switched from the Cymbalta.  She denies swelling, redness in her feet.    ROS See pertinent positives and negatives per HPI.    Objective:     BP 122/80 (BP Location: Right Arm)   Pulse 85   Temp (!) 97.4 F (36.3 C)   Ht 5\' 4"  (1.626 m)   Wt (!) 310 lb (140.6 kg)   SpO2 92%   BMI 53.21 kg/m  BP Readings from Last 3 Encounters:  10/04/22 122/80  06/27/22 138/88  05/29/22 132/78   Wt Readings from Last 3 Encounters:  10/04/22 (!) 310 lb (140.6 kg)  06/27/22 (!) 311 lb 3.2 oz (141.2 kg)  05/29/22 (!) 314 lb (142.4 kg)      Physical Exam Vitals and nursing note reviewed.  Constitutional:      General: She is not in acute distress.    Appearance: Normal appearance. She is obese.  HENT:     Head: Normocephalic.  Eyes:     Conjunctiva/sclera: Conjunctivae normal.  Cardiovascular:     Rate and Rhythm:  Normal rate and regular rhythm.     Pulses: Normal pulses.     Heart sounds: Murmur heard.  Pulmonary:     Effort: Pulmonary effort is normal.     Breath sounds: Normal breath sounds.  Musculoskeletal:     Cervical back: Normal range of motion.  Skin:    General: Skin is warm.  Neurological:     General: No focal deficit present.     Mental Status: She is alert and oriented to person, place, and time.  Psychiatric:        Mood and Affect: Mood normal.        Behavior: Behavior normal.        Thought Content: Thought content normal.        Judgment: Judgment normal.      Results for orders placed or performed in visit on 10/04/22  POCT glycosylated hemoglobin (Hb A1C)  Result Value Ref Range   Hemoglobin A1C 6.2 (A) 4.0 - 5.6 %   HbA1c POC (<> result, manual entry) 6.2 4.0 - 5.6 %   HbA1c, POC (prediabetic range) 6.2 5.7 - 6.4 %   HbA1c, POC (controlled  diabetic range) 6.2 0.0 - 7.0 %      The 10-year ASCVD risk score (Arnett DK, et al., 2019) is: 8.8%    Assessment & Plan:   Problem List Items Addressed This Visit       Respiratory   Reactive airway disease - Primary    Chronic, stable.  She states that her breathing has gotten better since starting the Advair inhaler.  She can continue this 1-2 times a day and rinse out her mouth after using.  Consider checking spirometry in the future if symptoms worsen again.        Other   Chronic midline low back pain    Chronic, not controlled.  She did not pick up the Lyrica as she forgot that this medication was switched.  Encouraged her to pick this up from the pharmacy as this will most likely help her feet pain that she is having as well.  Follow-up in 3 months or sooner with concerns.      Prediabetes    Chronic, stable.  A1c today is 6.2%.  She has been adjusting her diet and has lost 4 pounds since her last visit.  Encouraged her to continue focusing on nutrition.      Relevant Orders   POCT glycosylated  hemoglobin (Hb A1C) (Completed)   Other Visit Diagnoses     Immunization due       Flu and shingrix #1 given today   Relevant Orders   Flu vaccine trivalent PF, 6mos and older(Flulaval,Afluria,Fluarix,Fluzone) (Completed)   Varicella-zoster vaccine IM (Completed)       Return in about 3 months (around 01/03/2023) for back pain, foot pain.    Gerre Scull, NP

## 2022-10-04 NOTE — Patient Instructions (Signed)
It was great to see you!  We have a mammogram bus coming to our office on 10/14/22, 11/27/22, and 12/09/22 from 8:30-4:30pm. Call us and let us know if any of these dates work for you. [phone]  Start the lyrica 1 capsule daily to help with your back pain and foot pain.   Let's follow-up in 3 months, sooner if you have concerns.  If a referral was placed today, you will be contacted for an appointment. Please note that routine referrals can sometimes take up to 3-4 weeks to process. Please call our office if you haven't heard anything after this time frame.  Take care,  Rodman Pickle, NP

## 2022-12-04 ENCOUNTER — Other Ambulatory Visit: Payer: Self-pay | Admitting: Nurse Practitioner

## 2022-12-04 NOTE — Telephone Encounter (Signed)
Requesting: Pregabalin 75 MG Oral Capsule  Last Visit: 10/04/2022 Next Visit: 01/03/2023 Last Refill: 06/27/2022  Please Advise

## 2023-01-03 ENCOUNTER — Ambulatory Visit (INDEPENDENT_AMBULATORY_CARE_PROVIDER_SITE_OTHER): Payer: Commercial Managed Care - HMO | Admitting: Nurse Practitioner

## 2023-01-03 ENCOUNTER — Encounter: Payer: Self-pay | Admitting: Nurse Practitioner

## 2023-01-03 VITALS — BP 128/82 | HR 85 | Temp 97.2°F | Ht 64.0 in | Wt 307.2 lb

## 2023-01-03 DIAGNOSIS — M79672 Pain in left foot: Secondary | ICD-10-CM | POA: Diagnosis not present

## 2023-01-03 DIAGNOSIS — M79671 Pain in right foot: Secondary | ICD-10-CM

## 2023-01-03 DIAGNOSIS — J4520 Mild intermittent asthma, uncomplicated: Secondary | ICD-10-CM | POA: Diagnosis not present

## 2023-01-03 DIAGNOSIS — M5441 Lumbago with sciatica, right side: Secondary | ICD-10-CM

## 2023-01-03 DIAGNOSIS — G8929 Other chronic pain: Secondary | ICD-10-CM

## 2023-01-03 DIAGNOSIS — N393 Stress incontinence (female) (male): Secondary | ICD-10-CM

## 2023-01-03 MED ORDER — PREGABALIN 75 MG PO CAPS: 75.0000 mg | ORAL_CAPSULE | Freq: Two times a day (BID) | ORAL | 2 refills | Status: DC

## 2023-01-03 NOTE — Assessment & Plan Note (Signed)
Chronic, stable.  She states that her breathing has gotten better since starting the Advair inhaler.  She can continue this 1-2 times a day and rinse out her mouth after using.

## 2023-01-03 NOTE — Progress Notes (Signed)
Established Patient Office Visit  Subjective   Patient ID: Sandra Stafford, female    DOB: 1960-12-25  Age: 62 y.o. MRN: [id number]  Chief Complaint  Patient presents with   Back and Foot Pain    Follow up, medication not strong enough, concerns with cough and congestion    HPI  Discussed the use of AI scribe software for clinical note transcription with the patient, who gave verbal consent to proceed.  History of Present Illness   The patient presents to follow-up on severe foot pain, described as a stabbing, shooting pain that is sometimes so intense that she has to shake it out. The pain is localized to the foot and does not radiate up the leg. The patient also reports a knot on her foot that causes significant discomfort when wearing her normal walking shoes.  In addition to the foot pain, the patient is experiencing back pain that is exacerbated by coughing. The patient describes the pain as a sensation of shooting needles from the hip down the right side of the leg. The patient has been managing the pain with lyrica and Tylenol, but reports that these medications are not providing sufficient relief.  The patient has also been dealing with a cough and congestion for the past two weeks. The cough was initially producing green phlegm, but has since improved with the use of mucus relief medication. The patient reports that the coughing triggers urinary incontinence, which has become so severe that she has to wear pull-ups or pads every day. She denies fevers, shortness of breath, chest pain, and nasal congestion.       ROS See pertinent positives and negatives per HPI.    Objective:     BP 128/82 (BP Location: Left Arm)   Pulse 85   Temp (!) 97.2 F (36.2 C)   Ht 5\' 4"  (1.626 m)   Wt (!) 307 lb 3.2 oz (139.3 kg)   SpO2 96%   BMI 52.73 kg/m    Physical Exam Vitals and nursing note reviewed.  Constitutional:      General: She is not in acute distress.    Appearance:  Normal appearance. She is obese.  HENT:     Head: Normocephalic.  Eyes:     Conjunctiva/sclera: Conjunctivae normal.  Cardiovascular:     Rate and Rhythm: Normal rate and regular rhythm.     Pulses: Normal pulses.     Heart sounds: Normal heart sounds.  Pulmonary:     Effort: Pulmonary effort is normal.     Breath sounds: Rhonchi present.  Musculoskeletal:        General: No swelling or tenderness.     Cervical back: Normal range of motion.  Skin:    General: Skin is warm.  Neurological:     General: No focal deficit present.     Mental Status: She is alert and oriented to person, place, and time.  Psychiatric:        Mood and Affect: Mood normal.        Behavior: Behavior normal.        Thought Content: Thought content normal.        Judgment: Judgment normal.    The 10-year ASCVD risk score (Arnett DK, et al., 2019) is: 10%    Assessment & Plan:   Problem List Items Addressed This Visit       Respiratory   Reactive airway disease    Chronic, stable.  She states that her breathing has  gotten better since starting the Advair inhaler.  She can continue this 1-2 times a day and rinse out her mouth after using.          Other   Stress incontinence    Incontinence is triggered by coughing. We advise on Kegel exercises to strengthen the pelvic floor.       Chronic midline low back pain - Primary    Chronic, not controlled. Severe pain, exacerbated by movement and coughing, persists despite minimal relief from Lyrica and Tylenol. We will increase Lyrica 75mg  to twice daily, order an MRI of the back, and refer her to a back specialist.       Relevant Medications   pregabalin (LYRICA) 75 MG capsule   Other Relevant Orders   Ambulatory referral to Neurosurgery   MR LUMBAR SPINE W WO CONTRAST   Bilateral foot pain    She describes severe, stabbing, and shooting pain localized to the side of the foot and around the toes. We will increase the lyrica to 75mg  BID.        Return in about 3 months (around 04/03/2023) for back pain.    Gerre Scull, NP

## 2023-01-03 NOTE — Assessment & Plan Note (Signed)
She describes severe, stabbing, and shooting pain localized to the side of the foot and around the toes. We will increase the lyrica to 75mg  BID.

## 2023-01-03 NOTE — Assessment & Plan Note (Signed)
Incontinence is triggered by coughing. We advise on Kegel exercises to strengthen the pelvic floor.

## 2023-01-03 NOTE — Assessment & Plan Note (Signed)
Chronic, not controlled. Severe pain, exacerbated by movement and coughing, persists despite minimal relief from Lyrica and Tylenol. We will increase Lyrica 75mg  to twice daily, order an MRI of the back, and refer her to a back specialist.

## 2023-01-03 NOTE — Patient Instructions (Signed)
It was great to see you!  Increase the lyrica to twice a day  I have placed a referral to a spine specialist  I have ordered a MRI  Mammogram dates at our office:  January 13 February 17 March 17  Call [phone] to make an appointment.   Let's follow-up in 3 months, sooner if you have concerns.  If a referral was placed today, you will be contacted for an appointment. Please note that routine referrals can sometimes take up to 3-4 weeks to process. Please call our office if you haven't heard anything after this time frame.  Take care,  Rodman Pickle, NP

## 2023-01-08 ENCOUNTER — Telehealth: Payer: Self-pay

## 2023-01-08 NOTE — Telephone Encounter (Signed)
I called and notified patient and insurance will not cover MRI till 6 full weeks after starting Lyrica. Lauren wants patient to call back on 01-16-23 to let her know how pain in doing.

## 2023-01-20 ENCOUNTER — Other Ambulatory Visit: Payer: Self-pay | Admitting: Nurse Practitioner

## 2023-01-20 DIAGNOSIS — K219 Gastro-esophageal reflux disease without esophagitis: Secondary | ICD-10-CM

## 2023-01-20 NOTE — Telephone Encounter (Signed)
Requesting: PANTOPRAZOLE SODIUM DR TABS 20MG   Last Visit: 01/03/2023 Next Visit: 04/03/2023 Last Refill: 09/20/2022  Please Advise

## 2023-01-23 ENCOUNTER — Other Ambulatory Visit: Payer: Self-pay | Admitting: Nurse Practitioner

## 2023-01-23 DIAGNOSIS — Z1231 Encounter for screening mammogram for malignant neoplasm of breast: Secondary | ICD-10-CM

## 2023-02-08 ENCOUNTER — Other Ambulatory Visit: Payer: Commercial Managed Care - HMO

## 2023-02-10 ENCOUNTER — Inpatient Hospital Stay: Admission: RE | Admit: 2023-02-10 | Payer: Commercial Managed Care - HMO | Source: Ambulatory Visit

## 2023-03-17 ENCOUNTER — Ambulatory Visit
Admission: RE | Admit: 2023-03-17 | Discharge: 2023-03-17 | Disposition: A | Discharge status: Home / Self Care | Payer: Commercial Managed Care - HMO | Source: Ambulatory Visit | Attending: Nurse Practitioner | Admitting: Nurse Practitioner

## 2023-03-17 DIAGNOSIS — Z1231 Encounter for screening mammogram for malignant neoplasm of breast: Secondary | ICD-10-CM

## 2023-03-21 ENCOUNTER — Other Ambulatory Visit: Payer: Self-pay | Admitting: Nurse Practitioner

## 2023-03-21 DIAGNOSIS — R928 Other abnormal and inconclusive findings on diagnostic imaging of breast: Secondary | ICD-10-CM

## 2023-04-03 ENCOUNTER — Ambulatory Visit: Payer: Self-pay | Admitting: Nurse Practitioner

## 2023-04-03 ENCOUNTER — Encounter: Payer: Self-pay | Admitting: Nurse Practitioner

## 2023-04-03 VITALS — BP 132/76 | HR 92 | Temp 98.2°F | Ht 64.0 in | Wt 305.6 lb

## 2023-04-03 DIAGNOSIS — M5441 Lumbago with sciatica, right side: Secondary | ICD-10-CM | POA: Diagnosis not present

## 2023-04-03 DIAGNOSIS — J4520 Mild intermittent asthma, uncomplicated: Secondary | ICD-10-CM

## 2023-04-03 DIAGNOSIS — Z23 Encounter for immunization: Secondary | ICD-10-CM | POA: Diagnosis not present

## 2023-04-03 DIAGNOSIS — J301 Allergic rhinitis due to pollen: Secondary | ICD-10-CM | POA: Diagnosis not present

## 2023-04-03 DIAGNOSIS — R7303 Prediabetes: Secondary | ICD-10-CM

## 2023-04-03 DIAGNOSIS — Z6841 Body Mass Index (BMI) 40.0 and over, adult: Secondary | ICD-10-CM | POA: Diagnosis not present

## 2023-04-03 DIAGNOSIS — G8929 Other chronic pain: Secondary | ICD-10-CM

## 2023-04-03 DIAGNOSIS — E559 Vitamin D deficiency, unspecified: Secondary | ICD-10-CM | POA: Diagnosis not present

## 2023-04-03 LAB — CBC WITH DIFFERENTIAL/PLATELET
Basophils Absolute: 0.1 10*3/uL (ref 0.0–0.1)
Basophils Relative: 1 % (ref 0.0–3.0)
Eosinophils Absolute: 0.2 10*3/uL (ref 0.0–0.7)
Eosinophils Relative: 2.7 % (ref 0.0–5.0)
HCT: 46.3 % — ABNORMAL HIGH (ref 36.0–46.0)
Hemoglobin: 15 g/dL (ref 12.0–15.0)
Lymphocytes Relative: 19.5 % (ref 12.0–46.0)
Lymphs Abs: 1.7 10*3/uL (ref 0.7–4.0)
MCHC: 32.4 g/dL (ref 30.0–36.0)
MCV: 91.2 fl (ref 78.0–100.0)
Monocytes Absolute: 0.7 10*3/uL (ref 0.1–1.0)
Monocytes Relative: 7.7 % (ref 3.0–12.0)
Neutro Abs: 6.1 10*3/uL (ref 1.4–7.7)
Neutrophils Relative %: 69.1 % (ref 43.0–77.0)
Platelets: 193 10*3/uL (ref 150.0–400.0)
RBC: 5.07 Mil/uL (ref 3.87–5.11)
RDW: 13.8 % (ref 11.5–15.5)
WBC: 8.8 10*3/uL (ref 4.0–10.5)

## 2023-04-03 LAB — COMPREHENSIVE METABOLIC PANEL WITH GFR
ALT: 13 U/L (ref 0–35)
AST: 12 U/L (ref 0–37)
Albumin: 3.8 g/dL (ref 3.5–5.2)
Alkaline Phosphatase: 61 U/L (ref 39–117)
BUN: 9 mg/dL (ref 6–23)
CO2: 27 meq/L (ref 19–32)
Calcium: 8.9 mg/dL (ref 8.4–10.5)
Chloride: 103 meq/L (ref 96–112)
Creatinine, Ser: 0.63 mg/dL (ref 0.40–1.20)
GFR: 94.58 mL/min
Glucose, Bld: 97 mg/dL (ref 70–99)
Potassium: 4.1 meq/L (ref 3.5–5.1)
Sodium: 138 meq/L (ref 135–145)
Total Bilirubin: 0.2 mg/dL (ref 0.2–1.2)
Total Protein: 5.9 g/dL — ABNORMAL LOW (ref 6.0–8.3)

## 2023-04-03 LAB — LIPID PANEL
Cholesterol: 190 mg/dL (ref 0–200)
HDL: 59.2 mg/dL (ref >39.00)
LDL Cholesterol: 79 mg/dL (ref 0–99)
NonHDL: 130.73
Total CHOL/HDL Ratio: 3
Triglycerides: 258 mg/dL — ABNORMAL HIGH (ref 0.0–149.0)
VLDL: 51.6 mg/dL — ABNORMAL HIGH (ref 0.0–40.0)

## 2023-04-03 LAB — VITAMIN D 25 HYDROXY (VIT D DEFICIENCY, FRACTURES): VITD: 8.27 ng/mL — ABNORMAL LOW (ref 30.00–100.00)

## 2023-04-03 LAB — HEMOGLOBIN A1C: Hgb A1c MFr Bld: 6.4 % (ref 4.6–6.5)

## 2023-04-03 MED ORDER — FLUTICASONE-SALMETEROL 100-50 MCG/ACT IN AEPB: 1.0000 | INHALATION_SPRAY | Freq: Two times a day (BID) | RESPIRATORY_TRACT | 3 refills | Status: DC

## 2023-04-03 MED ORDER — FLUTICASONE PROPIONATE 50 MCG/ACT NA SUSP: NASAL | 6 refills | Status: DC

## 2023-04-03 NOTE — Progress Notes (Signed)
 Established Patient Office Visit  Subjective   Patient ID: Sandra Stafford, female    DOB: 04-11-1960  Age: 63 y.o. MRN: [id number]  Chief Complaint  Patient presents with   Back Pain    Follow up, medication management, Rx refills    HPI  Discussed the use of AI scribe software for clinical note transcription with the patient, who gave verbal consent to proceed.  History of Present Illness   The patient, with a history of arthritis, presents with persistent back pain that is not adequately controlled with Lyrica. She reports that the pain is constant and severe enough to cause her to lean over and stop moving. The pain is only manageable with the addition of Tylenol, which she takes either concurrently with the Lyrica or a few hours later. The patient also reports pain in her legs and under her feet. She has a history of carpal tunnel syndrome and arthritis in her hands.  The patient has been referred to a back specialist but has not yet made an appointment due to concerns about insurance coverage and transportation. She also mentions being busy with caring for her mother and brother, who has heart failure.  In addition to her back pain, the patient has been experiencing congestion and wheezing, which she attributes to pollen allergies. She has been using Flonase and Advair to manage these symptoms. She also reports feeling run down and has been taking cold medicine and sleeping a lot. She needs a refill on her advair.        ROS See pertinent positives and negatives per HPI.    Objective:     BP 132/76 (BP Location: Left Arm, Patient Position: Sitting, Cuff Size: Large)   Pulse 92   Temp 98.2 F (36.8 C)   Ht 5\' 4"  (1.626 m)   Wt (!) 305 lb 9.6 oz (138.6 kg)   SpO2 94%   BMI 52.46 kg/m    Physical Exam Vitals and nursing note reviewed.  Constitutional:      General: She is not in acute distress.    Appearance: Normal appearance. She is obese.  HENT:     Head:  Normocephalic.  Eyes:     Conjunctiva/sclera: Conjunctivae normal.  Cardiovascular:     Rate and Rhythm: Normal rate and regular rhythm.     Pulses: Normal pulses.     Heart sounds: Normal heart sounds.  Pulmonary:     Effort: Pulmonary effort is normal.     Breath sounds: Wheezing present.  Musculoskeletal:        General: No tenderness.     Cervical back: Normal range of motion.     Right lower leg: No edema.     Left lower leg: No edema.     Comments: Lumbar ROM limited due to pain  Skin:    General: Skin is warm.  Neurological:     General: No focal deficit present.     Mental Status: She is alert and oriented to person, place, and time.  Psychiatric:        Mood and Affect: Mood normal.        Behavior: Behavior normal.        Thought Content: Thought content normal.        Judgment: Judgment normal.    The 10-year ASCVD risk score (Arnett DK, et al., 2019) is: 11.8%    Assessment & Plan:   Problem List Items Addressed This Visit  Respiratory   Seasonal allergic rhinitis due to pollen   Symptoms are likely due to seasonal allergies and weather changes, with improvement noted from cold medicine and Flonase. Advair has been increased to twice daily. Continue Advair as prescribed, with the prescription sent to [facility], and continue Flonase for allergy management.       Relevant Medications   fluticasone (FLONASE) 50 MCG/ACT nasal spray   Reactive airway disease   Symptoms are likely due to seasonal allergies and weather changes, with improvement noted from cold medicine and Flonase. Advair has been increased to twice daily. Continue Advair as prescribed, with the prescription sent to [facility], and continue Flonase for allergy management.         Other   Morbid obesity (HCC)   BMI 52.4.  Discussed nutrition, exercise.      Relevant Orders   CBC with Differential/Platelet   Comprehensive metabolic panel   Hemoglobin A1c   Lipid panel    Vitamin D deficiency   Check vitamin D levels today and treat based on results.       Relevant Orders   VITAMIN D 25 Hydroxy (Vit-D Deficiency, Fractures)   Chronic midline low back pain - Primary   Chronic, ongoing. Pain extends to her legs and feet and is inadequately managed with the current Lyrica dosage. Continue Lyrica 75mg  BID and add in Tylenol, ensuring Tylenol does not exceed 3000 mg per day. Encourage daily stretching exercises and attempt to obtain insurance coverage for an MRI. She has been on lyrica for 5 months.       Prediabetes   Chronic, stable.  Check A1c today and treat based on results. Continue focus on nutrition and exercise as able.       Relevant Orders   CBC with Differential/Platelet   Comprehensive metabolic panel   Hemoglobin A1c   Lipid panel   Other Visit Diagnoses       Immunization due       Prevnar 20, tdap, and shingrix #2 given today   Relevant Orders   Tdap vaccine greater than or equal to 7yo IM (Completed)   Varicella-zoster vaccine IM (Completed)   Pneumococcal conjugate vaccine 20-valent (Completed)       Return in about 6 months (around 10/04/2023) for CPE.    Gerre Scull, NP

## 2023-04-03 NOTE — Assessment & Plan Note (Signed)
 Chronic, stable.  Check A1c today and treat based on results. Continue focus on nutrition and exercise as able.

## 2023-04-03 NOTE — Patient Instructions (Signed)
 It was great to see you!  Keep taking tylenol with the lyrica for your pain  Do the attached stretches  We are checking your labs today and will let you know the results via mychart/phone.   I have refilled your medications  Let's follow-up in 6 months, sooner if you have concerns.  If a referral was placed today, you will be contacted for an appointment. Please note that routine referrals can sometimes take up to 3-4 weeks to process. Please call our office if you haven't heard anything after this time frame.  Take care,  Rodman Pickle, NP

## 2023-04-03 NOTE — Assessment & Plan Note (Signed)
Check vitamin D levels today and treat based on results. 

## 2023-04-03 NOTE — Assessment & Plan Note (Signed)
 Symptoms are likely due to seasonal allergies and weather changes, with improvement noted from cold medicine and Flonase. Advair has been increased to twice daily. Continue Advair as prescribed, with the prescription sent to [facility], and continue Flonase for allergy management.

## 2023-04-03 NOTE — Assessment & Plan Note (Signed)
 BMI 52.4.  Discussed nutrition, exercise.

## 2023-04-03 NOTE — Assessment & Plan Note (Signed)
 Chronic, ongoing. Pain extends to her legs and feet and is inadequately managed with the current Lyrica dosage. Continue Lyrica 75mg  BID and add in Tylenol, ensuring Tylenol does not exceed 3000 mg per day. Encourage daily stretching exercises and attempt to obtain insurance coverage for an MRI. She has been on lyrica for 5 months.

## 2023-04-04 MED ORDER — VITAMIN D (ERGOCALCIFEROL) 1.25 MG (50000 UNIT) PO CAPS: 50000.0000 [IU] | ORAL_CAPSULE | ORAL | 0 refills | Status: DC

## 2023-04-04 NOTE — Addendum Note (Signed)
 Addended by: Rodman Pickle A on: 04/04/2023 08:21 AM   Modules accepted: Orders

## 2023-04-08 ENCOUNTER — Telehealth: Payer: Self-pay

## 2023-04-08 NOTE — Telephone Encounter (Signed)
 Result Note Please let Makinzy know her kidneys, liver, blood counts, and cholesterol are normal. Her sugars are stable in the prediabetic range. Her vitamin D is low, I am going to send in a vitamin D supplement to take once a week for 12 weeks, then start vitamin D supplement over the counter 2,000 units daily.      Patient called and was provided results by Aggie Cosier with CRM.

## 2023-04-08 NOTE — Telephone Encounter (Signed)
 Copied from CRM [id number]. Topic: General - Other >> Apr 08, 2023 12:45 PM Eunice Blase wrote: Reason for CRM: Pt returning call for lab results which were provided.

## 2023-05-01 ENCOUNTER — Other Ambulatory Visit: Payer: Self-pay | Admitting: Nurse Practitioner

## 2023-05-01 MED ORDER — PREGABALIN 75 MG PO CAPS: 75.0000 mg | ORAL_CAPSULE | Freq: Two times a day (BID) | ORAL | 2 refills | Status: DC

## 2023-05-01 NOTE — Telephone Encounter (Signed)
 Copied from CRM [id number]. Topic: Clinical - Medication Refill >> May 01, 2023  1:30 PM Pascal Lux wrote: Most Recent Primary Care Visit:  Provider: Rodman Pickle A  Department: [facility]  Visit Type: OFFICE VISIT  Date: 04/03/2023  Medication: pregabalin (LYRICA) 75 MG capsule [[id number]] fluticasone-salmeterol (ADVAIR) 100-50 MCG/ACT AEPB [[id number]]  Has the patient contacted their pharmacy? No (Agent: If no, request that the patient contact the pharmacy for the refill. If patient does not wish to contact the pharmacy document the reason why and proceed with request.) (Agent: If yes, when and what did the pharmacy advise?) new pharmacy  Is this the correct pharmacy for this prescription? Yes If no, delete pharmacy and type the correct one.  This is the patient's preferred pharmacy:  [facility] [address] [place] - [address] [place] [address] Phone: [phone] Fax: [phone]   Has the prescription been filled recently? Yes  Is the patient out of the medication? Yes  Has the patient been seen for an appointment in the last year OR does the patient have an upcoming appointment? Yes  Can we respond through MyChart? No  Agent: Please be advised that Rx refills may take up to 3 business days. We ask that you follow-up with your pharmacy.

## 2023-05-01 NOTE — Telephone Encounter (Signed)
 Refill request for  Pregablin 75 mg LR  01/03/23. #60, #2 rf LOV  04/03/23 FOV 10/06/23  Please review and advise.  Thanks.  Dm/cma

## 2023-05-01 NOTE — Addendum Note (Signed)
 Addended by: Waymond Cera on: 05/01/2023 03:49 PM   Modules accepted: Orders

## 2023-05-01 NOTE — Telephone Encounter (Signed)
 Existing refills.

## 2023-05-02 NOTE — Telephone Encounter (Signed)
 Patient notified VIA phone. Dm/cma

## 2023-05-10 DIAGNOSIS — Z419 Encounter for procedure for purposes other than remedying health state, unspecified: Secondary | ICD-10-CM | POA: Diagnosis not present

## 2023-05-16 ENCOUNTER — Other Ambulatory Visit: Payer: Self-pay | Admitting: Nurse Practitioner

## 2023-05-16 DIAGNOSIS — K219 Gastro-esophageal reflux disease without esophagitis: Secondary | ICD-10-CM

## 2023-05-16 NOTE — Telephone Encounter (Signed)
 Copied from CRM [id number]. Topic: Clinical - Medication Refill >> May 16, 2023  3:11 PM Orien Bird wrote: Most Recent Primary Care Visit:  Provider: Rheba Cedar A  Department: [facility]  Visit Type: OFFICE VISIT  Date: 04/03/2023  Medication: pantoprazole  (PROTONIX ) 20 MG tablet, fluticasone -salmeterol (ADVAIR) 100-50 MCG/ACT AEPB  Has the patient contacted their pharmacy? Yes (Agent: If no, request that the patient contact the pharmacy for the refill. If patient does not wish to contact the pharmacy document the reason why and proceed with request.) (Agent: If yes, when and what did the pharmacy advise?)  Is this the correct pharmacy for this prescription? Yes If no, delete pharmacy and type the correct one.  This is the patient's preferred pharmacy:  [facility] [address], [place] - [address] [place] [address] Phone: [phone] Fax: [phone]   Has the prescription been filled recently? No  Is the patient out of the medication? Yes  Has the patient been seen for an appointment in the last year OR does the patient have an upcoming appointment? Yes  Can we respond through MyChart? No  Agent: Please be advised that Rx refills may take up to 3 business days. We ask that you follow-up with your pharmacy.

## 2023-05-19 ENCOUNTER — Other Ambulatory Visit: Payer: Self-pay

## 2023-05-19 DIAGNOSIS — K219 Gastro-esophageal reflux disease without esophagitis: Secondary | ICD-10-CM

## 2023-05-19 MED ORDER — FLUTICASONE-SALMETEROL 100-50 MCG/ACT IN AEPB: 1.0000 | INHALATION_SPRAY | Freq: Two times a day (BID) | RESPIRATORY_TRACT | 3 refills | Status: DC

## 2023-05-19 MED ORDER — PANTOPRAZOLE SODIUM 20 MG PO TBEC: 20.0000 mg | DELAYED_RELEASE_TABLET | Freq: Every day | ORAL | 3 refills | Status: AC

## 2023-05-27 ENCOUNTER — Ambulatory Visit
Admission: RE | Admit: 2023-05-27 | Discharge: 2023-05-27 | Disposition: A | Discharge status: Home / Self Care | Source: Ambulatory Visit | Attending: Nurse Practitioner | Admitting: Nurse Practitioner

## 2023-05-27 DIAGNOSIS — R928 Other abnormal and inconclusive findings on diagnostic imaging of breast: Secondary | ICD-10-CM

## 2023-05-27 DIAGNOSIS — R922 Inconclusive mammogram: Secondary | ICD-10-CM | POA: Diagnosis not present

## 2023-05-27 DIAGNOSIS — N6489 Other specified disorders of breast: Secondary | ICD-10-CM | POA: Diagnosis not present

## 2023-05-30 ENCOUNTER — Other Ambulatory Visit: Payer: Self-pay | Admitting: Nurse Practitioner

## 2023-05-30 DIAGNOSIS — N6489 Other specified disorders of breast: Secondary | ICD-10-CM

## 2023-06-05 ENCOUNTER — Other Ambulatory Visit (HOSPITAL_COMMUNITY): Payer: Self-pay

## 2023-06-05 ENCOUNTER — Telehealth: Payer: Self-pay

## 2023-06-05 NOTE — Telephone Encounter (Signed)
 Pharmacy Patient Advocate Encounter   Received notification from CoverMyMeds that prior authorization for Pregabalin  75MG  capsules is required/requested.   Insurance verification completed.   The patient is insured through [employer]  .   Per test claim: The current 30 day co-pay is, $4.00.  No PA needed at this time. This test claim was processed through [facility]- copay amounts may vary at other pharmacies due to pharmacy/plan contracts, or as the patient moves through the different stages of their insurance plan.

## 2023-06-09 DIAGNOSIS — Z419 Encounter for procedure for purposes other than remedying health state, unspecified: Secondary | ICD-10-CM | POA: Diagnosis not present

## 2023-06-13 ENCOUNTER — Ambulatory Visit (INDEPENDENT_AMBULATORY_CARE_PROVIDER_SITE_OTHER): Admitting: Family Medicine

## 2023-06-13 ENCOUNTER — Ambulatory Visit: Payer: Self-pay | Admitting: Family Medicine

## 2023-06-13 ENCOUNTER — Ambulatory Visit (INDEPENDENT_AMBULATORY_CARE_PROVIDER_SITE_OTHER)

## 2023-06-13 ENCOUNTER — Encounter: Payer: Self-pay | Admitting: Family Medicine

## 2023-06-13 VITALS — BP 130/78 | HR 80 | Temp 96.9°F | Ht 64.0 in | Wt 305.8 lb

## 2023-06-13 DIAGNOSIS — R202 Paresthesia of skin: Secondary | ICD-10-CM

## 2023-06-13 DIAGNOSIS — R200 Anesthesia of skin: Secondary | ICD-10-CM

## 2023-06-13 DIAGNOSIS — M79601 Pain in right arm: Secondary | ICD-10-CM | POA: Diagnosis not present

## 2023-06-13 DIAGNOSIS — M47812 Spondylosis without myelopathy or radiculopathy, cervical region: Secondary | ICD-10-CM | POA: Diagnosis not present

## 2023-06-13 MED ORDER — NAPROXEN 500 MG PO TABS: 500.0000 mg | ORAL_TABLET | Freq: Two times a day (BID) | ORAL | 0 refills | Status: AC

## 2023-06-13 NOTE — Progress Notes (Signed)
 [facility] [address] [place] [address] Dept: [phone] Dept Fax: [phone]  Office Visit  Subjective:    Patient ID: Sandra Stafford, female    DOB: January 27, 1961, 63 y.o..   MRN: [id number]  Chief Complaint  Patient presents with   Arm Pain    C/o having bilateral arm pain x 4-5 days.  Has been taking Tylenol  Arthritis, heating pad and lidocaine patch.    History of Present Illness:  Patient is in today complaining of an exacerbation of right arm pain and tingling since this past weekend. Ms. admits to some right arm pain issues over a period of years. 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 However, she has not worked now for over a year. She notes she gets periodic flares of her arm pain when lifting or carrying heavy items. She does not recall doing any extraordinary lifting this past weekend. She feels the pain she is having now is much worse that what she has had in the past. Review of her records indicates that she has a number of somatic and back pain issues. She is currently managed on pregabalin  75 mg bid.  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   Thrombocytopenia (HCC) 06/21/2020   GERD (gastroesophageal reflux disease)    Reactive airway disease    Anxiety    Stress incontinence 11/22/2019   Aortic stenosis mild, based on echo from 2021 10/01/2019   DOE (dyspnea on exertion) 08/04/2019   Heart murmur 07/05/2019   Seasonal allergic rhinitis due to pollen 06/02/2019   Vitamin D  deficiency 05/22/2019   Arthralgia 05/17/2019   CARCINOMA, 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     albuterol  (VENTOLIN  HFA) 108 (90 Base) MCG/ACT inhaler Inhale 1-2 puffs into the lungs every 6 (six) hours as needed for wheezing or shortness of breath. Or cough. 8 g 2   fluticasone  (FLONASE ) 50 MCG/ACT nasal spray Use 2 spray(s) in each nostril once daily 16 g 6   fluticasone -salmeterol (ADVAIR) 100-50 MCG/ACT AEPB Inhale 1 puff into the lungs 2 (two) times daily. 1 each 3   pantoprazole  (PROTONIX ) 20 MG tablet Take 1 tablet (20 mg total) by mouth daily. 90 tablet 3   pregabalin  (LYRICA ) 75 MG capsule Take 1 capsule (75 mg total) by mouth 2 (two) times daily. 60 capsule 2   Vitamin D , Ergocalciferol , (DRISDOL ) 1.25 MG (50000 UNIT) CAPS capsule Take 1 capsule (50,000 Units total) by mouth every 7 (seven) days. 12 capsule 0   No facility-administered medications prior to visit.   Allergies  Allergen Reactions   Dust  Mite Mixed Allergen Ext [Mite (D. Farinae)]    Naproxen Other (See Comments)    Abd pain    Percocet [Oxycodone-Acetaminophen ] Nausea And Vomiting   Pollen Extract      Objective:   Today's Vitals   06/13/23 1053  BP: 130/78  Pulse: 80  Temp: (!) 96.9 F (36.1 C)  TempSrc: Temporal  SpO2: 94%  Weight: (!) 305 lb 12.8 oz (138.7 kg)  Height: 5\' 4"  (1.626 m)   Body mass index is 52.49 kg/m.   General: Well developed, well nourished. No acute distress. Neck: Supple. Good ROM. No specific pain to palpation, though she indicates tension along hte right   lateral neck muscles down to the shoulder. Extremities: Full ROM of the UEs. No joint swelling or  tenderness, though there may be mild swelling of   the right arm compared to the left. Strength is 5/5. Subjectively, she feels like her right armis tingling or   feels "asleep" compared to the left, but this does not follow any specific dermatomes.  There are no preventive care reminders to display for this patient.    Assessment & Plan:  1. Numbness and tingling of right arm (Primary) 2. Right arm pain  Ms. Zuloaga is having an acute exacerbation of chronic right arm pain and tingling. It is unclear what may have precipitated this. She had prior cervical x-rays in 2022 showing some mild degenerative changes of C4-7. I recommend we obtain a new set of x-rays. I will treat her with a course of naproxen for 7 days. She has a past history of abdominal pain due to naproxen, but I feel the short-course is not likley to cause her issues. If she is improved, she can follow-up with Ms. McElwee routinely. If not improved with naproxen, she should see Ms. McElwee sooner.  - DG Cervical Spine Complete; Future - naproxen (NAPROSYN) 500 MG tablet; Take 1 tablet (500 mg total) by mouth 2 (two) times daily with a meal.  Dispense: 14 tablet; Refill: 0   Return in about 10 days (around 06/23/2023), or if symptoms worsen or fail to improve. Plan follow-up with PCP.   Graig Lawyer, MD

## 2023-06-24 ENCOUNTER — Encounter: Payer: Self-pay | Admitting: Nurse Practitioner

## 2023-06-24 ENCOUNTER — Ambulatory Visit (INDEPENDENT_AMBULATORY_CARE_PROVIDER_SITE_OTHER): Admitting: Nurse Practitioner

## 2023-06-24 VITALS — BP 130/78 | HR 98 | Temp 98.9°F | Ht 64.0 in | Wt 301.0 lb

## 2023-06-24 DIAGNOSIS — M542 Cervicalgia: Secondary | ICD-10-CM | POA: Insufficient documentation

## 2023-06-24 DIAGNOSIS — J22 Unspecified acute lower respiratory infection: Secondary | ICD-10-CM | POA: Diagnosis not present

## 2023-06-24 DIAGNOSIS — M79671 Pain in right foot: Secondary | ICD-10-CM

## 2023-06-24 MED ORDER — PREGABALIN 100 MG PO CAPS: 100.0000 mg | ORAL_CAPSULE | Freq: Two times a day (BID) | ORAL | 2 refills | Status: DC

## 2023-06-24 MED ORDER — PREDNISONE 20 MG PO TABS: 40.0000 mg | ORAL_TABLET | Freq: Every day | ORAL | 0 refills | Status: DC

## 2023-06-24 NOTE — Assessment & Plan Note (Signed)
 Right foot pain with a palpable knot affects gait. A podiatrist referral was discussed for further evaluation. Refer to a podiatrist.

## 2023-06-24 NOTE — Patient Instructions (Signed)
 It was great to see you!  Let's increase your lyrica  to 100mg  twice a day   I am giving you some stretches to do daily  I have placed a referral to a foot doctor  I have ordered an MRI of your neck.   Let's follow-up in 4 weeks, sooner if you have concerns.  If a referral was placed today, you will be contacted for an appointment. Please note that routine referrals can sometimes take up to 3-4 weeks to process. Please call our office if you haven't heard anything after this time frame.  Take care,  Rheba Cedar, NP

## 2023-06-24 NOTE — Assessment & Plan Note (Signed)
 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 Symptoms improved slightly with naproxen . Start prednisone  40mg  daily with food x5 days. Will increase lyrica  to 100mg  BID. Order MRI of cervical spine and gave daily stretches to perform. Advise against naproxen , ibuprofen, or Aleve  while on prednisone .

## 2023-06-24 NOTE — Progress Notes (Signed)
 Established Patient Office Visit  Subjective   Patient ID: Sandra Stafford, female    DOB: [birth year]  Age: 63 y.o. MRN: [id number]  Chief Complaint  Patient presents with   Arm Pain    Follow up, right arm numbness and tingling    HPI Discussed the use of AI scribe software for clinical note transcription with the patient, who gave verbal consent to proceed.  History of Present Illness   Sandra Stafford is a 63 year old female who presents with persistent arm pain and numbness.  She experiences persistent pain and numbness in the right arm, with radiation to the forearm and hands, particularly affecting the pointer and middle fingers. The left arm is also affected but to a lesser degree. She completed a week of naproxen  and continues to take Lyrica . An x-ray of the neck shows arthritis. She states that the pain and tingling has gotten slightly better since her appointment last month, but is still very bothersome.   She reports right foot pain, described as a knot that affects walking and contributes to back pain. The pain was severe but lasted only a couple of hours. It will flare up at times. She would like a referral to a foot doctor.   She has respiratory symptoms, including a cough with green phlegm and wheezing, exacerbated by air conditioning. She has covered the air conditioning to prevent worsening arm pain. She has some mild nasal congestion, but denies fever.       ROS See pertinent positives and negatives per HPI.    Objective:     BP 130/78 (BP Location: Left Arm, Patient Position: Sitting, Cuff Size: Normal)   Pulse 98   Temp 98.9 F (37.2 C)   Ht 5\' 4"  (1.626 m)   Wt (!) 301 lb (136.5 kg)   SpO2 94%   BMI 51.67 kg/m    Physical Exam Vitals and nursing note reviewed.  Constitutional:      General: She is not in acute distress.    Appearance: Normal appearance.  HENT:     Head: Normocephalic.  Eyes:     Conjunctiva/sclera: Conjunctivae normal.   Cardiovascular:     Rate and Rhythm: Normal rate and regular rhythm.     Pulses: Normal pulses.     Heart sounds: Murmur heard.  Pulmonary:     Effort: Pulmonary effort is normal.     Breath sounds: Wheezing present.  Musculoskeletal:        General: Tenderness (right shoulder) present. Normal range of motion.     Cervical back: Normal range of motion.  Skin:    General: Skin is warm.  Neurological:     General: No focal deficit present.     Mental Status: She is alert and oriented to person, place, and time.  Psychiatric:        Mood and Affect: Mood normal.        Behavior: Behavior normal.        Thought Content: Thought content normal.        Judgment: Judgment normal.    The 10-year ASCVD risk score (Arnett DK, et al., 2019) is: 12.5%    Assessment & Plan:   Problem List Items Addressed This Visit       Respiratory   Lower respiratory infection   Cough with green phlegm and wheezing suggests a possible respiratory infection. Prednisone  is prescribed to reduce inflammation. Prescribe prednisone , 40mg  once a day for 5 days. Advise taking prednisone   in the morning with food.        Other   Right foot pain   Right foot pain with a palpable knot affects gait. A podiatrist referral was discussed for further evaluation. Refer to a podiatrist.      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 Symptoms improved slightly with naproxen . Start prednisone  40mg  daily with food x5 days. Will increase lyrica  to 100mg  BID. Order MRI of cervical spine and gave daily stretches to perform. Advise against naproxen , ibuprofen, or Aleve  while on prednisone .      Relevant Orders   MR Cervical Spine Wo Contrast   Return in about 4 weeks (around 07/22/2023) for neck pain.    Sandra Benjamin, NP

## 2023-06-24 NOTE — Assessment & Plan Note (Signed)
 Cough with green phlegm and wheezing suggests a possible respiratory infection. Prednisone  is prescribed to reduce inflammation. Prescribe prednisone , 40mg  once a day for 5 days. Advise taking prednisone  in the morning with food.

## 2023-07-07 ENCOUNTER — Other Ambulatory Visit: Payer: Self-pay | Admitting: Nurse Practitioner

## 2023-07-07 DIAGNOSIS — J301 Allergic rhinitis due to pollen: Secondary | ICD-10-CM

## 2023-07-08 NOTE — Telephone Encounter (Signed)
 Requesting: Fluticasone  Propionate 50 MCG/ACT Nasal Suspension  Last Visit: 06/24/2023 Next Visit: 07/23/2023 Last Refill: 04/03/2023  Please Advise

## 2023-07-10 DIAGNOSIS — Z419 Encounter for procedure for purposes other than remedying health state, unspecified: Secondary | ICD-10-CM | POA: Diagnosis not present

## 2023-07-23 ENCOUNTER — Encounter: Payer: Self-pay | Admitting: Nurse Practitioner

## 2023-07-23 ENCOUNTER — Ambulatory Visit (INDEPENDENT_AMBULATORY_CARE_PROVIDER_SITE_OTHER): Admitting: Nurse Practitioner

## 2023-07-23 VITALS — BP 138/86 | HR 93 | Temp 96.6°F | Ht 64.0 in | Wt 307.6 lb

## 2023-07-23 DIAGNOSIS — M542 Cervicalgia: Secondary | ICD-10-CM | POA: Diagnosis not present

## 2023-07-23 DIAGNOSIS — J301 Allergic rhinitis due to pollen: Secondary | ICD-10-CM

## 2023-07-23 DIAGNOSIS — J4521 Mild intermittent asthma with (acute) exacerbation: Secondary | ICD-10-CM | POA: Diagnosis not present

## 2023-07-23 MED ORDER — FLUTICASONE-SALMETEROL 100-50 MCG/ACT IN AEPB: 1.0000 | INHALATION_SPRAY | Freq: Two times a day (BID) | RESPIRATORY_TRACT | 3 refills | Status: DC

## 2023-07-23 MED ORDER — ALBUTEROL SULFATE HFA 108 (90 BASE) MCG/ACT IN AERS: 1.0000 | INHALATION_SPRAY | Freq: Four times a day (QID) | RESPIRATORY_TRACT | 2 refills | Status: DC | PRN

## 2023-07-23 MED ORDER — FLUTICASONE PROPIONATE 50 MCG/ACT NA SUSP: NASAL | 1 refills | Status: DC

## 2023-07-23 MED ORDER — TIZANIDINE HCL 4 MG PO TABS: 4.0000 mg | ORAL_TABLET | Freq: Three times a day (TID) | ORAL | 0 refills | Status: DC | PRN

## 2023-07-23 NOTE — Patient Instructions (Signed)
 It was great to see you!  You are due for repeat mammogram in [facility] [address] [place], [address] [place], [place] [phone]  Keep drinking plenty of fluids   You can take mucinex  and flonase  to help with the cough   Start tizanidine as needed for muscle tightness/spasms  Let's follow-up in 3 months, sooner if you have concerns.  If a referral was placed today, you will be contacted for an appointment. Please note that routine referrals can sometimes take up to 3-4 weeks to process. Please call our office if you haven't heard anything after this time frame.  Take care,  Tinnie Harada, NP

## 2023-07-23 NOTE — Assessment & Plan Note (Signed)
 Exacerbation is due to missed Advair doses, increased congestion, and wheezing, with recent prednisone  use for a flare-up. Refill Advair and instruct her to take it twice daily. Refill the albuterol  inhaler and instruct her to use it every four hours as needed. Restart Flonase  and monitor symptoms. She should contact the office if symptoms worsen or persist for a week for a potential prednisone  prescription.

## 2023-07-23 NOTE — Assessment & Plan Note (Signed)
Continue flonase nasal spray daily

## 2023-07-23 NOTE — Progress Notes (Signed)
 Established Patient Office Visit  Subjective   Patient ID: Sandra Stafford, female    DOB: May 11, 1960  Age: 63 y.o. MRN: [id number]  Chief Complaint  Patient presents with   Neck Pain    Follow up, concerns with stiffness in neck, productive cough for 2 days   HPI:  Discussed the use of AI scribe software for clinical note transcription with the patient, who gave verbal consent to proceed.  History of Present Illness   Sandra Stafford is a 63 year old female who presents with persistent cough and congestion.  She experiences a persistent cough, which she associates with the heat and air conditioning in her home. The cough is accompanied by rhinorrhea when the fan is off, and she wakes up coughing. She denies fever, sore throat, or otalgia. She has not been using Flonase  nasal spray due to unavailability. This start 2 days ago. She also did not use her advair inhaler yesterday.   She experiences dyspnea, particularly when exposed to heat and humidity. She uses an inhaler daily and notes increased congestion when she misses doses of Advair. She also uses Mucinex  and drinks plenty of fluids to manage her symptoms.  She experiences ongoing cervical stiffness, particularly in the upper neck extending into the head. She notes improvement in arm pain unless exposed to cold air, which she tries to mitigate by covering herself.        ROS See pertinent positives and negatives per HPI.    Objective:     BP 138/86 (BP Location: Right Arm, Patient Position: Sitting, Cuff Size: Large)   Pulse 93   Temp (!) 96.6 F (35.9 C)   Ht 5' 4 (1.626 m)   Wt (!) 307 lb 9.6 oz (139.5 kg)   SpO2 95%   BMI 52.80 kg/m    Physical Exam Vitals and nursing note reviewed.  Constitutional:      General: She is not in acute distress.    Appearance: Normal appearance.  HENT:     Head: Normocephalic.   Eyes:     Conjunctiva/sclera: Conjunctivae normal.    Cardiovascular:     Rate and Rhythm:  Normal rate and regular rhythm.     Pulses: Normal pulses.     Heart sounds: Normal heart sounds.  Pulmonary:     Effort: Pulmonary effort is normal.     Breath sounds: Wheezing present.   Musculoskeletal:     Cervical back: Normal range of motion and neck supple. No tenderness.  Lymphadenopathy:     Cervical: No cervical adenopathy.   Skin:    General: Skin is warm.   Neurological:     General: No focal deficit present.     Mental Status: She is alert and oriented to person, place, and time.   Psychiatric:        Mood and Affect: Mood normal.        Behavior: Behavior normal.        Thought Content: Thought content normal.        Judgment: Judgment normal.     The 10-year ASCVD risk score (Arnett DK, et al., 2019) is: 14.5%    Assessment & Plan:   Problem List Items Addressed This Visit       Respiratory   Seasonal allergic rhinitis due to pollen   Continue flonase  nasal spray daily.       Relevant Medications   fluticasone  (FLONASE ) 50 MCG/ACT nasal spray   Reactive airway disease  Exacerbation is due to missed Advair doses, increased congestion, and wheezing, with recent prednisone  use for a flare-up. Refill Advair and instruct her to take it twice daily. Refill the albuterol  inhaler and instruct her to use it every four hours as needed. Restart Flonase  and monitor symptoms. She should contact the office if symptoms worsen or persist for a week for a potential prednisone  prescription.      Relevant Medications   albuterol  (VENTOLIN  HFA) 108 (90 Base) MCG/ACT inhaler     Other   Cervicalgia - Primary   Chronic neck stiffness extends into the head, with previous prednisone  use providing relief. Prescribe tizanidine 4mg  TID prn for muscle relaxation and stiffness relief, cautioning about potential drowsiness. Continue regular stretching       Return in about 3 months (around 10/23/2023) for CPE.    Tinnie DELENA Harada, NP

## 2023-07-23 NOTE — Assessment & Plan Note (Signed)
 Chronic neck stiffness extends into the head, with previous prednisone  use providing relief. Prescribe tizanidine 4mg  TID prn for muscle relaxation and stiffness relief, cautioning about potential drowsiness. Continue regular stretching

## 2023-07-25 ENCOUNTER — Ambulatory Visit: Payer: Self-pay

## 2023-07-25 MED ORDER — PROMETHAZINE-DM 6.25-15 MG/5ML PO SYRP
5.0000 mL | ORAL_SOLUTION | Freq: Four times a day (QID) | ORAL | 0 refills | Status: DC | PRN
Start: 2023-07-25 — End: 2023-10-06

## 2023-07-25 NOTE — Addendum Note (Signed)
 Addended by: Gerarda Conklin A on: 07/25/2023 12:03 PM   Modules accepted: Orders

## 2023-07-25 NOTE — Telephone Encounter (Signed)
 I called patient and left a detailed message regarding medication sent to pharmacy.

## 2023-07-25 NOTE — Telephone Encounter (Signed)
 Additional Information . Commented on: Answer Assessment    Requesting medication for cough. Was just in office recently and pcp said to call in for worsening symptoms  Protocols used: Cough - Acute Productive-A-AH

## 2023-07-25 NOTE — Telephone Encounter (Addendum)
 FYI Only or Action Required?: Action required by provider: update on patient condition.  Patient was last seen in primary care on 07/23/2023 by Nedra Tinnie LABOR, NP. Called Nurse Triage reporting Cough. Symptoms began several days ago. Interventions attempted: OTC medications: mucinex . Symptoms are: rapidly worsening.  Triage Disposition: See Physician Within 24 Hours  Patient/caregiver understands and will follow disposition?: No, refuses disposition  Copied from CRM [id number]. Topic: Clinical - Red Word Triage >> Jul 25, 2023  9:07 AM Robinson H wrote: [facility] that prompted transfer to Nurse Triage: Was in on the 25th and cough is worse, violent coughing that has head hurting Reason for Disposition  SEVERE coughing spells (e.g., whooping sound after coughing, vomiting after coughing)  Answer Assessment - Initial Assessment Questions 1. ONSET: When did the cough begin?      Several days 2. SEVERITY: How bad is the cough today?      Violent 3. SPUTUM: Describe the color of your sputum (none, dry cough; clear, white, yellow, green)     yellow 4. HEMOPTYSIS: Are you coughing up any blood? If so ask: How much? (flecks, streaks, tablespoons, etc.)     Denies 5. DIFFICULTY BREATHING: Are you having difficulty breathing? If Yes, ask: How bad is it? (e.g., mild, moderate, severe)    - MILD: No SOB at rest, mild SOB with walking, speaks normally in sentences, can lie down, no retractions, pulse < 100.    - MODERATE: SOB at rest, SOB with minimal exertion and prefers to sit, cannot lie down flat, speaks in phrases, mild retractions, audible wheezing, pulse 100-120.    - SEVERE: Very SOB at rest, speaks in single words, struggling to breathe, sitting hunched forward, retractions, pulse > 120      Mild 6. FEVER: Do you have a fever? If Yes, ask: What is your temperature, how was it measured, and when did it start?     Denies but using Tylenol  7. CARDIAC HISTORY: Do you have any  history of heart disease? (e.g., heart attack, congestive heart failure)      Yes 8. LUNG HISTORY: Do you have any history of lung disease?  (e.g., pulmonary embolus, asthma, emphysema)     yes      9. OTHER SYMPTOMS: Do you have any other symptoms? (e.g., runny nose, wheezing, chest pain)       Runny nose Additional info: Ventolin  Q 6 hours with effect.  Protocols used: Cough - Acute Productive-A-AH

## 2023-08-07 ENCOUNTER — Other Ambulatory Visit: Payer: Self-pay | Admitting: Nurse Practitioner

## 2023-08-07 NOTE — Telephone Encounter (Signed)
 Requesting: Pregabalin  75 MG Oral Capsule  Last Visit: 07/23/2023 Next Visit: 10/06/2023 Last Refill: 07/07/2023  Please Advise

## 2023-08-09 DIAGNOSIS — Z419 Encounter for procedure for purposes other than remedying health state, unspecified: Secondary | ICD-10-CM | POA: Diagnosis not present

## 2023-08-20 DIAGNOSIS — Z7689 Persons encountering health services in other specified circumstances: Secondary | ICD-10-CM | POA: Diagnosis not present

## 2023-08-27 DIAGNOSIS — Z7689 Persons encountering health services in other specified circumstances: Secondary | ICD-10-CM | POA: Diagnosis not present

## 2023-09-03 DIAGNOSIS — Z7689 Persons encountering health services in other specified circumstances: Secondary | ICD-10-CM | POA: Diagnosis not present

## 2023-09-09 DIAGNOSIS — Z419 Encounter for procedure for purposes other than remedying health state, unspecified: Secondary | ICD-10-CM | POA: Diagnosis not present

## 2023-09-22 ENCOUNTER — Other Ambulatory Visit: Payer: Self-pay | Admitting: Nurse Practitioner

## 2023-09-22 DIAGNOSIS — N6489 Other specified disorders of breast: Secondary | ICD-10-CM

## 2023-09-23 ENCOUNTER — Encounter: Payer: Self-pay | Admitting: Podiatry

## 2023-09-23 ENCOUNTER — Ambulatory Visit (INDEPENDENT_AMBULATORY_CARE_PROVIDER_SITE_OTHER)

## 2023-09-23 ENCOUNTER — Ambulatory Visit (INDEPENDENT_AMBULATORY_CARE_PROVIDER_SITE_OTHER): Admitting: Podiatry

## 2023-09-23 VITALS — Ht 64.0 in | Wt 305.0 lb

## 2023-09-23 DIAGNOSIS — M7751 Other enthesopathy of right foot: Secondary | ICD-10-CM

## 2023-09-23 DIAGNOSIS — M722 Plantar fascial fibromatosis: Secondary | ICD-10-CM | POA: Diagnosis not present

## 2023-09-23 DIAGNOSIS — M7752 Other enthesopathy of left foot: Secondary | ICD-10-CM | POA: Diagnosis not present

## 2023-09-23 DIAGNOSIS — Q6651 Congenital pes planus, right foot: Secondary | ICD-10-CM | POA: Diagnosis not present

## 2023-09-23 DIAGNOSIS — B353 Tinea pedis: Secondary | ICD-10-CM

## 2023-09-23 DIAGNOSIS — Q6652 Congenital pes planus, left foot: Secondary | ICD-10-CM

## 2023-09-23 MED ORDER — KETOCONAZOLE 2 % EX CREA
1.0000 | TOPICAL_CREAM | Freq: Every day | CUTANEOUS | 2 refills | Status: AC
Start: 2023-09-23 — End: ?

## 2023-09-23 NOTE — Progress Notes (Unsigned)
 Chief Complaint  Patient presents with   Foot Pain    Bilateral foot pain in heel.  R plantar nodule is very sore to walk on.  L lateral 5th meta has stabbing pain that comes and goes.  All has been going on for over 2 years.  Non diabetic. No anti coag.    HPI: 63 y.o. female presenting today with c/o pain in the bottom of the right arch.  Pain is rated as 5/10.  She has concern of a mass in the bottom of the right foot in this area.  States it gives her some pain.  Denies stepping on a foreign object.    She also has some peeling and itching discomfort to the heels bilateral.    She has tertiary concern is flattened arches.  She states her feet typically hurt bilateral daily.  Past Medical History:  Diagnosis Date   Abnormal Pap smear of cervix    ALLERGIC RHINITIS 11/03/2006   Qualifier: Diagnosis of  By: Candia Moats     Allergy    Anxiety    Aortic stenosis mild, based on echo from 2021 10/01/2019   Arthritis    Asthma    CARCINOMA, VULVA 11/03/2006   Qualifier: Diagnosis of  By: Candia Moats     Class 3 severe obesity due to excess calories with body mass index (BMI) of 40.0 to 44.9 in adult 11/03/2006   Qualifier: Diagnosis of  By: Candia Moats     Depression    Depression with anxiety 06/02/2019   DOE (dyspnea on exertion) 08/04/2019   DYSPEPSIA 11/03/2006   Qualifier: History of  By: Candia Moats     Fibroid    Genital warts    GERD (gastroesophageal reflux disease)    Healthcare maintenance 05/17/2019   Heart murmur 07/05/2019   Medication side effect 10/20/2019   Muscle cramps 06/02/2019   Need for 23-polyvalent pneumococcal polysaccharide vaccine 10/20/2019   Need for influenza vaccination 10/20/2019   Polyarthralgia 05/17/2019   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HCC) 05/17/2019   PUD 11/03/2006   Qualifier: Diagnosis of  By: Candia Moats     Seasonal allergic rhinitis due to pollen 06/02/2019   STD (sexually transmitted disease)    hpv (genital warts    Stress incontinence 11/22/2019   Tobacco use 11/03/2006   Qualifier: Diagnosis of  By: Candia Moats     VENEREAL WART 11/03/2006   Qualifier: Diagnosis of  By: Candia Moats     Vitamin D  deficiency 05/22/2019   Past Surgical History:  Procedure Laterality Date   ABDOMINAL HYSTERECTOMY     CERVIX LESION DESTRUCTION     CESAREAN SECTION     SALPINGECTOMY     Allergies  Allergen Reactions   Dust Mite Mixed Allergen Ext [Mite (D. Farinae)]    Naproxen  Other (See Comments)    Abd pain    Percocet [Oxycodone-Acetaminophen ] Nausea And Vomiting   Pollen Extract      Physical Exam: General: The patient is alert and oriented x3 in no acute distress.  Dermatology:  No ecchymosis, erythema, or edema bilateral.  No open lesions.  There is peeling and erythema to the plantar aspect of bilateral heels.  No blister formation is appreciated.  Vascular: Palpable pedal pulses bilaterally. Capillary refill within normal limits.  No appreciable edema.    Neurological: Epicritic sensation is intact  Musculoskeletal Exam:  There is pain on palpation at a palpable firm mass in the plantar aspect of the right arch along the central plantar  fascial band.  This is nonpulsatile.  This is not superficially mobile indicating it is in the deeper soft tissue structures.  Positive Windlass mechanism bilateral.  Antalgic gait noted with first few steps upon standing.  No pain on palpation of achilles tendon bilateral.  Ankle df less than 10 degrees with knee extended b/l.  Flexible pes planus foot type bilateral and resting calcaneal stance position.  Radiographic Exam (bilateral foot views, 09/23/2023):  Normal osseous mineralization. Joint spaces preserved.  No fractures noted.  Inferior calcaneal spur bilateral.  Mild first metatarsal lobotomy noted on lateral view bilateral.  Accessory sesamoid 2nd and 5th tarsal heads right and abduction of the metatarsals on the left.  Assessment/Plan of Care: 1.  Plantar fascial fibromatosis of right foot   2. Capsulitis of metatarsophalangeal (MTP) joint of left foot   3. Capsulitis of metatarsophalangeal (MTP) joint of right foot   4. Tinea pedis of both feet   5. Congenital pes planus of left foot   6. Congenital pes planus of right foot     Meds ordered this encounter  Medications   ketoconazole  (NIZORAL ) 2 % cream    Sig: Apply 1 Application topically daily. Apply 1gm to bottom of feet once daily until resolved.    Dispense:  60 g    Refill:  2   FOR HOME USE ONLY DME [facility]UST[facility] ORTHOTICS  -Reviewed etiology of plantar fasciitis with patient.  Discussed treatment options with patient today, including cortisone injection, NSAID course of treatment, stretching exercises, physical therapy, use of night splint, rest, icing the heel, arch supports/orthotics, and supportive shoe gear.    With the patient's verbal consent, a corticosteroid injection was administered to the right plantar fibroma , consisting of a mixture of 1% lidocaine plain, 0.5% Sensorcaine plain, and Kenalog-10 for a total of 1.25cc administered.  A Band-aid was applied. Pain level post-injection is 3/10.  Prescription for ketoconazole  2% cream sent to her pharmacy to apply once daily to the plantar aspect of the heels.  Recommend custom orthotics for this patient.  If the plantar fibroma on the right plantar midfoot is not resolved when seen in the pedorthist, she could have a depression made in the orthotic shell to offload the painful mass.  Return for Set up for orthotic consult with Lolita.   Awanda CHARM Imperial, DPM, FACFAS [facility]     [address] [place], [place] [address]                Office [phone]  Fax [phone]

## 2023-10-06 ENCOUNTER — Ambulatory Visit: Payer: Self-pay | Admitting: Nurse Practitioner

## 2023-10-06 ENCOUNTER — Encounter: Payer: Self-pay | Admitting: Nurse Practitioner

## 2023-10-06 ENCOUNTER — Ambulatory Visit: Admitting: Nurse Practitioner

## 2023-10-06 VITALS — BP 130/86 | HR 82 | Temp 96.5°F | Ht 64.0 in | Wt 306.4 lb

## 2023-10-06 DIAGNOSIS — K219 Gastro-esophageal reflux disease without esophagitis: Secondary | ICD-10-CM

## 2023-10-06 DIAGNOSIS — Z0001 Encounter for general adult medical examination with abnormal findings: Secondary | ICD-10-CM | POA: Diagnosis not present

## 2023-10-06 DIAGNOSIS — E781 Pure hyperglyceridemia: Secondary | ICD-10-CM | POA: Diagnosis not present

## 2023-10-06 DIAGNOSIS — R7303 Prediabetes: Secondary | ICD-10-CM | POA: Diagnosis not present

## 2023-10-06 DIAGNOSIS — M5441 Lumbago with sciatica, right side: Secondary | ICD-10-CM

## 2023-10-06 DIAGNOSIS — Z23 Encounter for immunization: Secondary | ICD-10-CM | POA: Diagnosis not present

## 2023-10-06 DIAGNOSIS — R0683 Snoring: Secondary | ICD-10-CM | POA: Diagnosis not present

## 2023-10-06 DIAGNOSIS — R3915 Urgency of urination: Secondary | ICD-10-CM | POA: Diagnosis not present

## 2023-10-06 DIAGNOSIS — Z0000 Encounter for general adult medical examination without abnormal findings: Secondary | ICD-10-CM

## 2023-10-06 DIAGNOSIS — Z720 Tobacco use: Secondary | ICD-10-CM

## 2023-10-06 DIAGNOSIS — E559 Vitamin D deficiency, unspecified: Secondary | ICD-10-CM | POA: Diagnosis not present

## 2023-10-06 DIAGNOSIS — G8929 Other chronic pain: Secondary | ICD-10-CM

## 2023-10-06 DIAGNOSIS — Z7689 Persons encountering health services in other specified circumstances: Secondary | ICD-10-CM | POA: Diagnosis not present

## 2023-10-06 LAB — POCT URINALYSIS DIPSTICK
Bilirubin, UA: NEGATIVE
Blood, UA: NEGATIVE
Glucose, UA: NEGATIVE
Ketones, UA: NEGATIVE
Leukocytes, UA: NEGATIVE
Nitrite, UA: NEGATIVE
Protein, UA: NEGATIVE
Spec Grav, UA: 1.025 (ref 1.010–1.025)
Urobilinogen, UA: 0.2 U/dL
pH, UA: 6 (ref 5.0–8.0)

## 2023-10-06 LAB — LIPID PANEL
Cholesterol: 186 mg/dL (ref 0–200)
HDL: 71.4 mg/dL (ref >39.00)
LDL Cholesterol: 97 mg/dL (ref 0–99)
NonHDL: 114.17
Total CHOL/HDL Ratio: 3
Triglycerides: 86 mg/dL (ref 0.0–149.0)
VLDL: 17.2 mg/dL (ref 0.0–40.0)

## 2023-10-06 LAB — CBC WITH DIFFERENTIAL/PLATELET
Basophils Absolute: 0 K/uL (ref 0.0–0.1)
Basophils Relative: 0.6 % (ref 0.0–3.0)
Eosinophils Absolute: 0.3 K/uL (ref 0.0–0.7)
Eosinophils Relative: 3.5 % (ref 0.0–5.0)
HCT: 46.1 % — ABNORMAL HIGH (ref 36.0–46.0)
Hemoglobin: 15 g/dL (ref 12.0–15.0)
Lymphocytes Relative: 20.3 % (ref 12.0–46.0)
Lymphs Abs: 1.6 K/uL (ref 0.7–4.0)
MCHC: 32.6 g/dL (ref 30.0–36.0)
MCV: 89.1 fl (ref 78.0–100.0)
Monocytes Absolute: 0.6 K/uL (ref 0.1–1.0)
Monocytes Relative: 7.6 % (ref 3.0–12.0)
Neutro Abs: 5.2 K/uL (ref 1.4–7.7)
Neutrophils Relative %: 68 % (ref 43.0–77.0)
Platelets: 185 K/uL (ref 150.0–400.0)
RBC: 5.18 Mil/uL — ABNORMAL HIGH (ref 3.87–5.11)
RDW: 13.8 % (ref 11.5–15.5)
WBC: 7.7 K/uL (ref 4.0–10.5)

## 2023-10-06 LAB — COMPREHENSIVE METABOLIC PANEL WITH GFR
ALT: 17 U/L (ref 0–35)
AST: 18 U/L (ref 0–37)
Albumin: 4.1 g/dL (ref 3.5–5.2)
Alkaline Phosphatase: 59 U/L (ref 39–117)
BUN: 10 mg/dL (ref 6–23)
CO2: 32 meq/L (ref 19–32)
Calcium: 9.7 mg/dL (ref 8.4–10.5)
Chloride: 101 meq/L (ref 96–112)
Creatinine, Ser: 0.75 mg/dL (ref 0.40–1.20)
GFR: 84.58 mL/min (ref >60.00)
Glucose, Bld: 93 mg/dL (ref 70–99)
Potassium: 4.1 meq/L (ref 3.5–5.1)
Sodium: 140 meq/L (ref 135–145)
Total Bilirubin: 0.3 mg/dL (ref 0.2–1.2)
Total Protein: 6.7 g/dL (ref 6.0–8.3)

## 2023-10-06 LAB — VITAMIN D 25 HYDROXY (VIT D DEFICIENCY, FRACTURES): VITD: 25.94 ng/mL — ABNORMAL LOW (ref 30.00–100.00)

## 2023-10-06 LAB — HEMOGLOBIN A1C: Hgb A1c MFr Bld: 6.6 % — ABNORMAL HIGH (ref 4.6–6.5)

## 2023-10-06 MED ORDER — BLOOD GLUCOSE TEST VI STRP: 1.0000 | ORAL_STRIP | Freq: Every day | 0 refills | Status: AC

## 2023-10-06 MED ORDER — LANCET DEVICE MISC: 1.0000 | Freq: Every day | 0 refills | Status: AC

## 2023-10-06 MED ORDER — BLOOD GLUCOSE MONITORING SUPPL DEVI: 1.0000 | Freq: Every day | 0 refills | Status: AC

## 2023-10-06 MED ORDER — LANCETS MISC. MISC: 1.0000 | Freq: Every day | 0 refills | Status: AC

## 2023-10-06 NOTE — Assessment & Plan Note (Signed)
Check vitamin D levels today and treat based on results. 

## 2023-10-06 NOTE — Assessment & Plan Note (Signed)
 Chronic, stable.  Check A1c today and treat based on results. Continue focus on nutrition and exercise as able.

## 2023-10-06 NOTE — Assessment & Plan Note (Signed)
 Chronic, ongoing. She is having ongoing low back pain, unable to stand for 15 minutes at a time. She has been taking lyrica  75mg  BID. She is also having urinary symptoms, frequency and urgency. Will order MRI of her lumbar spine to evaluate.

## 2023-10-06 NOTE — Progress Notes (Signed)
 BP 130/86 (BP Location: Right Arm, Patient Position: Sitting, Cuff Size: Large)   Pulse 82   Temp (!) 96.5 F (35.8 C)   Ht 5' 4 (1.626 m)   Wt (!) 306 lb 6.4 oz (139 kg)   SpO2 96%   BMI 52.59 kg/m    Subjective:    Patient ID: Sandra Stafford, female    DOB: 01-03-1961, 63 y.o.   MRN: [id number]  CC: Chief Complaint  Patient presents with   Annual Exam    With fasting labs, concerns with frequent urination, FLU Vaccine    HPI: Sandra Stafford is a 63 y.o. female presenting on 10/06/2023 for comprehensive medical examination. Current medical complaints include:frequent urination  Discussed the use of AI scribe software for clinical note transcription with the patient, who gave verbal consent to proceed.  She urinates every one to two hours, day and night, despite limiting fluid intake to a gallon of water daily, completed by 7 or 8 PM. She feels incomplete bladder emptying and strains to void. There is no dysuria or fever. Symptoms began after stopping work, where she had to hold urine due to work constraints.  Urinary incontinence occurs, especially when transitioning from lying down. A portable toilet is in her room, but she often cannot reach it in time, leading to accidents. She experiences urgency and sometimes cannot reach the bathroom if occupied.  She has back pain limiting her ability to stand for more than 10-15 minutes, feels unsteady, and has experienced falls. Tingling and sensitivity in her legs are present. An MRI has not been done due to insurance denial.     She currently lives with: brother Menopausal Symptoms: no  Depression and Anxiety Screen done today and results listed below:     10/06/2023    8:48 AM 10/04/2022    9:39 AM 06/27/2022   10:44 AM 05/29/2022    1:28 PM 04/23/2021    1:38 PM  Depression screen PHQ 2/9  Decreased Interest 2 2 3 1  0  Down, Depressed, Hopeless 1 1 2  0 0  PHQ - 2 Score 3 3 5 1  0  Altered sleeping 2 3 3 3    Tired, decreased energy 2  3 1 3    Change in appetite 2 0 1 1   Feeling bad or failure about yourself  1 0 1 0   Trouble concentrating 1 1 1 1    Moving slowly or fidgety/restless 2 1 1  0   Suicidal thoughts 0 0 0 0   PHQ-9 Score 13 11 13 9    Difficult doing work/chores Somewhat difficult Somewhat difficult Very difficult        10/06/2023    8:49 AM 10/04/2022    9:39 AM 06/27/2022   10:45 AM 05/29/2022    1:30 PM  GAD 7 : Generalized Anxiety Score  Nervous, Anxious, on Edge 3 1 2 2   Control/stop worrying 2 1 2 3   Worry too much - different things 2 1 2 3   Trouble relaxing 2 2 2 2   Restless 1 1 1  0  Easily annoyed or irritable 1 1 1 3   Afraid - awful might happen 1 1 1 1   Total GAD 7 Score 12 8 11 14   Anxiety Difficulty Somewhat difficult Somewhat difficult Very difficult     The patient has a history of falls. I did complete a risk assessment for falls. A plan of care for falls was documented.   Past Medical History:  Past Medical  History:  Diagnosis Date   Abnormal Pap smear of cervix    ALLERGIC RHINITIS 11/03/2006   Qualifier: Diagnosis of  By: Candia Moats     Allergy    Anxiety    Aortic stenosis mild, based on echo from 2021 10/01/2019   Arthritis    Asthma    CARCINOMA, VULVA 11/03/2006   Qualifier: Diagnosis of  By: Candia Moats     Class 3 severe obesity due to excess calories with body mass index (BMI) of 40.0 to 44.9 in adult 11/03/2006   Qualifier: Diagnosis of  By: Candia Moats     Depression    Depression with anxiety 06/02/2019   DOE (dyspnea on exertion) 08/04/2019   DYSPEPSIA 11/03/2006   Qualifier: History of  By: Candia Moats     Fibroid    Genital warts    GERD (gastroesophageal reflux disease)    Healthcare maintenance 05/17/2019   Heart murmur 07/05/2019   Medication side effect 10/20/2019   Muscle cramps 06/02/2019   Need for 23-polyvalent pneumococcal polysaccharide vaccine 10/20/2019   Need for influenza vaccination 10/20/2019   Polyarthralgia  05/17/2019   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HCC) 05/17/2019   PUD 11/03/2006   Qualifier: Diagnosis of  By: Candia Moats     Seasonal allergic rhinitis due to pollen 06/02/2019   STD (sexually transmitted disease)    hpv (genital warts   Stress incontinence 11/22/2019   Tobacco use 11/03/2006   Qualifier: Diagnosis of  By: Candia Moats     VENEREAL WART 11/03/2006   Qualifier: Diagnosis of  By: Candia Moats     Vitamin D  deficiency 05/22/2019    Surgical History:  Past Surgical History:  Procedure Laterality Date   ABDOMINAL HYSTERECTOMY     CERVIX LESION DESTRUCTION     CESAREAN SECTION     SALPINGECTOMY      Medications:  Current Outpatient Medications on File Prior to Visit  Medication Sig   Acetaminophen  (TYLENOL  ARTHRITIS PAIN PO) Take by mouth.   albuterol  (VENTOLIN  HFA) 108 (90 Base) MCG/ACT inhaler Inhale 1-2 puffs into the lungs every 6 (six) hours as needed for wheezing or shortness of breath. Or cough.   cholecalciferol (VITAMIN D3) 25 MCG (1000 UNIT) tablet Take 2,000 Units by mouth daily.   fluticasone  (FLONASE ) 50 MCG/ACT nasal spray Use 2 spray(s) in each nostril once daily   fluticasone -salmeterol (ADVAIR) 100-50 MCG/ACT AEPB Inhale 1 puff into the lungs 2 (two) times daily.   ketoconazole  (NIZORAL ) 2 % cream Apply 1 Application topically daily. Apply 1gm to bottom of feet once daily until resolved.   naproxen  (NAPROSYN ) 500 MG tablet Take 1 tablet (500 mg total) by mouth 2 (two) times daily with a meal.   pantoprazole  (PROTONIX ) 20 MG tablet Take 1 tablet (20 mg total) by mouth daily.   pregabalin  (LYRICA ) 75 MG capsule Take 1 capsule by mouth twice daily   tiZANidine  (ZANAFLEX ) 4 MG tablet Take 1 tablet (4 mg total) by mouth every 8 (eight) hours as needed for muscle spasms.   No current facility-administered medications on file prior to visit.    Allergies:  Allergies  Allergen Reactions   Dust Mite Mixed Allergen Ext [Mite (D. Farinae)]    Naproxen  Other  (See Comments)    Abd pain    Percocet [Oxycodone-Acetaminophen ] Nausea And Vomiting   Pollen Extract     Social History:  Social History   Socioeconomic History   Marital status: Single    Spouse name: Not on file  Number of children: Not on file   Years of education: Not on file   Highest education level: Not on file  Occupational History   Not on file  Tobacco Use   Smoking status: Every Day    Current packs/day: 0.25    Average packs/day: 0.3 packs/day for 50.0 years (12.5 ttl pk-yrs)    Types: Cigarettes   Smokeless tobacco: Never  Vaping Use   Vaping status: Never Used  Substance and Sexual Activity   Alcohol use: Yes    Comment: rarely   Drug use: No   Sexual activity: Not Currently    Birth control/protection: Surgical    Comment: hysterectomy  Other Topics Concern   Not on file  Social History Narrative   Not on file   Social Drivers of Health   Financial Resource Strain: Not on file  Food Insecurity: Not on file  Transportation Needs: Not on file  Physical Activity: Not on file  Stress: Not on file  Social Connections: Not on file  Intimate Partner Violence: Not on file   Social History   Tobacco Use  Smoking Status Every Day   Current packs/day: 0.25   Average packs/day: 0.3 packs/day for 50.0 years (12.5 ttl pk-yrs)   Types: Cigarettes  Smokeless Tobacco Never   Social History   Substance and Sexual Activity  Alcohol Use Yes   Comment: rarely    Family History:  Family History  Problem Relation Age of Onset   Cancer Mother        unsure type   Diabetes Mother    Hypertension Mother    Diabetes Father    Colon cancer Cousin     Past medical history, surgical history, medications, allergies, family history and social history reviewed with patient today and changes made to appropriate areas of the chart.   Review of Systems  Constitutional:  Positive for malaise/fatigue. Negative for fever.  HENT: Negative.    Eyes: Negative.    Respiratory: Negative.    Cardiovascular: Negative.   Gastrointestinal: Negative.   Genitourinary:  Positive for frequency and urgency. Negative for dysuria.  Musculoskeletal:  Positive for back pain.  Skin: Negative.   Neurological: Negative.   Psychiatric/Behavioral: Negative.     All other ROS negative except what is listed above and in the HPI.      Objective:    BP 130/86 (BP Location: Right Arm, Patient Position: Sitting, Cuff Size: Large)   Pulse 82   Temp (!) 96.5 F (35.8 C)   Ht 5' 4 (1.626 m)   Wt (!) 306 lb 6.4 oz (139 kg)   SpO2 96%   BMI 52.59 kg/m   Wt Readings from Last 3 Encounters:  10/06/23 (!) 306 lb 6.4 oz (139 kg)  09/23/23 (!) 305 lb (138.3 kg)  07/23/23 (!) 307 lb 9.6 oz (139.5 kg)    Physical Exam Vitals and nursing note reviewed.  Constitutional:      General: She is not in acute distress.    Appearance: Normal appearance. She is obese.  HENT:     Head: Normocephalic and atraumatic.     Right Ear: Tympanic membrane, ear canal and external ear normal.     Left Ear: Tympanic membrane, ear canal and external ear normal.     Mouth/Throat:     Mouth: Mucous membranes are moist.     Dentition: Abnormal dentition (broken teeth).     Pharynx: No posterior oropharyngeal erythema.  Eyes:     Conjunctiva/sclera: Conjunctivae  normal.  Cardiovascular:     Rate and Rhythm: Normal rate and regular rhythm.     Pulses: Normal pulses.     Heart sounds: Murmur heard.  Pulmonary:     Effort: Pulmonary effort is normal.     Breath sounds: Normal breath sounds.  Abdominal:     Palpations: Abdomen is soft.     Tenderness: There is no abdominal tenderness.  Musculoskeletal:        General: Normal range of motion.     Cervical back: Normal range of motion and neck supple.     Right lower leg: No edema.     Left lower leg: No edema.  Lymphadenopathy:     Cervical: No cervical adenopathy.  Skin:    General: Skin is warm and dry.  Neurological:      General: No focal deficit present.     Mental Status: She is alert and oriented to person, place, and time.     Cranial Nerves: No cranial nerve deficit.     Coordination: Coordination normal.     Gait: Gait normal.  Psychiatric:        Mood and Affect: Mood normal.        Behavior: Behavior normal.        Thought Content: Thought content normal.        Judgment: Judgment normal.     Results for orders placed or performed in visit on 10/06/23  POCT urinalysis dipstick   Collection Time: 10/06/23  9:35 AM  Result Value Ref Range   Color, UA     Clarity, UA     Glucose, UA Negative Negative   Bilirubin, UA Negative    Ketones, UA Negative    Spec Grav, UA 1.025 1.010 - 1.025   Blood, UA Negative    pH, UA 6.0 5.0 - 8.0   Protein, UA Negative Negative   Urobilinogen, UA 0.2 0.2 or 1.0 E.U./dL   Nitrite, UA Negative    Leukocytes, UA Negative Negative   Appearance     Odor        Assessment & Plan:   Problem List Items Addressed This Visit       Digestive   GERD (gastroesophageal reflux disease)   Chronic, ongoing. Continue Protonix  20 mg daily.        Other   Morbid obesity (HCC)   BMI 52.5.  Discussed nutrition, exercise.      Tobacco use   She is currently smoking a quarter of a pack a day.  Recommend complete tobacco cessation.      Vitamin D  deficiency   Check vitamin D  levels today and treat based on results.       Relevant Orders   VITAMIN D  25 Hydroxy (Vit-D Deficiency, Fractures)   Chronic midline low back pain   Chronic, ongoing. She is having ongoing low back pain, unable to stand for 15 minutes at a time. She has been taking lyrica  75mg  BID. She is also having urinary symptoms, frequency and urgency. Will order MRI of her lumbar spine to evaluate.       Relevant Orders   MR Lumbar Spine Wo Contrast   Prediabetes   Chronic, stable.  Check A1c today and treat based on results. Continue focus on nutrition and exercise as able.       Relevant  Orders   Hemoglobin A1c   High triglycerides   Check CMP, CBC, lipid panel today      Relevant Orders  CBC with Differential/Platelet   Comprehensive metabolic panel with GFR   Lipid panel   Other Visit Diagnoses       Routine general medical examination at a health care facility    -  Primary   Health maintenance reviewed and updated. Discussed nutrition, exercise. Recommend dental visit. F/U 1 year.     Immunization due       Flu vaccine given today   Relevant Orders   Flu vaccine trivalent PF, 6mos and older(Flulaval,Afluria,Fluarix,Fluzone) (Completed)     Snoring       Referral placed to sleep medicine for possible sleep study.   Relevant Orders   Ambulatory referral to Sleep Studies     Urinary urgency       U/A negative. Unsure if this is related to back pain. Will get MRI of lumbar spine.   Relevant Orders   MR Lumbar Spine Wo Contrast   POCT urinalysis dipstick (Completed)        Follow up plan: Return in about 3 months (around 01/05/2024) for back pain.   LABORATORY TESTING:  - Pap smear: up to date  IMMUNIZATIONS:   - Tdap: Tetanus vaccination status reviewed: last tetanus booster within 10 years. - Influenza: Administered today - Pneumovax: Not applicable - Prevnar: Up to date - HPV: Not applicable - Shingrix  vaccine: Up to date  SCREENING: -Mammogram: Up to date  - Colonoscopy: Up to date  - Bone Density: Not applicable   PATIENT COUNSELING:   Advised to take 1 mg of folate supplement per day if capable of pregnancy.   Sexuality: Discussed sexually transmitted diseases, partner selection, use of condoms, avoidance of unintended pregnancy  and contraceptive alternatives.   Advised to avoid cigarette smoking.  I discussed with the patient that most people either abstain from alcohol or drink within safe limits (<=14/week and <=4 drinks/occasion for males, <=7/weeks and <= 3 drinks/occasion for females) and that the risk for alcohol disorders and  other health effects rises proportionally with the number of drinks per week and how often a drinker exceeds daily limits.  Discussed cessation/primary prevention of drug use and availability of treatment for abuse.   Diet: Encouraged to adjust caloric intake to maintain  or achieve ideal body weight, to reduce intake of dietary saturated fat and total fat, to limit sodium intake by avoiding high sodium foods and not adding table salt, and to maintain adequate dietary potassium and calcium preferably from fresh fruits, vegetables, and low-fat dairy products.    stressed the importance of regular exercise  Injury prevention: Discussed safety belts, safety helmets, smoke detector, smoking near bedding or upholstery.   Dental health: Discussed importance of regular tooth brushing, flossing, and dental visits.    NEXT PREVENTATIVE PHYSICAL DUE IN 1 YEAR. Return in about 3 months (around 01/05/2024) for back pain.  Jackalynn Art A Arley Garant

## 2023-10-06 NOTE — Assessment & Plan Note (Signed)
 BMI 52.5.  Discussed nutrition, exercise.

## 2023-10-06 NOTE — Assessment & Plan Note (Signed)
 Chronic, ongoing. Continue Protonix  20 mg daily.

## 2023-10-06 NOTE — Assessment & Plan Note (Signed)
Check CMP, CBC, lipid panel today.  

## 2023-10-06 NOTE — Patient Instructions (Addendum)
 It was great to see you!  We are checking your labs today and will let you know the results via mychart/phone.   I have ordered a MRI of your back   I have placed a referral to sleep medicine  Let's follow-up in 3 months, sooner if you have concerns.  If a referral was placed today, you will be contacted for an appointment. Please note that routine referrals can sometimes take up to 3-4 weeks to process. Please call our office if you haven't heard anything after this time frame.  Take care,  Tinnie Harada, NP

## 2023-10-06 NOTE — Assessment & Plan Note (Signed)
 She is currently smoking a quarter of a pack a day.  Recommend complete tobacco cessation.

## 2023-10-10 DIAGNOSIS — Z419 Encounter for procedure for purposes other than remedying health state, unspecified: Secondary | ICD-10-CM | POA: Diagnosis not present

## 2023-10-16 ENCOUNTER — Ambulatory Visit

## 2023-10-16 ENCOUNTER — Encounter: Payer: Self-pay | Admitting: Nurse Practitioner

## 2023-10-16 DIAGNOSIS — Z7689 Persons encountering health services in other specified circumstances: Secondary | ICD-10-CM | POA: Diagnosis not present

## 2023-10-16 NOTE — Progress Notes (Signed)
 Orthotics   Patient was present and evaluated for Custom molded foot orthotics. Patient will benefit from CFO's to provide total contact to BIL MLA's helping to balance and distribute body weight more evenly across BIL feet helping to reduce plantar pressure and pain. Orthotic will also encourage FF / RF alignment  Patient was scanned today and will return for fitting upon receipt  [employer] covers [phone]

## 2023-10-21 ENCOUNTER — Telehealth: Payer: Self-pay | Admitting: Nurse Practitioner

## 2023-10-21 NOTE — Telephone Encounter (Signed)
 Copied from CRM [id number]. Topic: Referral - Question >> Oct 21, 2023  8:52 AM Suzen RAMAN wrote: Reason for CRM: Referral:[id number] Lumbar Spine Prior authorization was denied. [facility] would like to know if provider would like to complete a P2P(deeming scan medically necessary) before making patient aware of denial.   [phone] ext [phone]

## 2023-10-22 ENCOUNTER — Encounter: Payer: Self-pay | Admitting: Nurse Practitioner

## 2023-10-22 NOTE — Telephone Encounter (Signed)
 Left message for patient to return call regarding imaging denial.

## 2023-10-23 ENCOUNTER — Ambulatory Visit: Admitting: Nurse Practitioner

## 2023-10-24 NOTE — Telephone Encounter (Signed)
 I called and spoke with patient and she will schedule to have a ride bring her next Tuesday or Wednesday to come in to sign form for an appeal for her Imaging.   Patient has canceled imaging since it is not being covered by insurance.

## 2023-10-24 NOTE — Telephone Encounter (Signed)
 Copied from CRM #[id number]. Topic: General - Other >> Oct 24, 2023 10:43 AM Robinson H wrote: Reason for CRM: Patient returning call to [place] at office, patient states she already knows insurance won't cover MRI so she cancelled  [facility] [phone]

## 2023-10-25 ENCOUNTER — Other Ambulatory Visit

## 2023-10-27 ENCOUNTER — Encounter: Admitting: Nurse Practitioner

## 2023-10-28 NOTE — Telephone Encounter (Signed)
 Form signed by patient and faxed to [facility] at [phone].

## 2023-11-05 ENCOUNTER — Telehealth: Payer: Self-pay

## 2023-11-05 NOTE — Telephone Encounter (Signed)
 Orthotics are here Charges not entered Financial form signed and on file Appt needed

## 2023-11-09 DIAGNOSIS — Z419 Encounter for procedure for purposes other than remedying health state, unspecified: Secondary | ICD-10-CM | POA: Diagnosis not present

## 2023-11-12 ENCOUNTER — Ambulatory Visit (INDEPENDENT_AMBULATORY_CARE_PROVIDER_SITE_OTHER)

## 2023-11-12 DIAGNOSIS — M2142 Flat foot [pes planus] (acquired), left foot: Secondary | ICD-10-CM | POA: Diagnosis not present

## 2023-11-12 DIAGNOSIS — M7752 Other enthesopathy of left foot: Secondary | ICD-10-CM

## 2023-11-12 DIAGNOSIS — M7751 Other enthesopathy of right foot: Secondary | ICD-10-CM

## 2023-11-12 DIAGNOSIS — M722 Plantar fascial fibromatosis: Secondary | ICD-10-CM | POA: Diagnosis not present

## 2023-11-12 DIAGNOSIS — M2141 Flat foot [pes planus] (acquired), right foot: Secondary | ICD-10-CM

## 2023-11-12 DIAGNOSIS — Z7689 Persons encountering health services in other specified circumstances: Secondary | ICD-10-CM | POA: Diagnosis not present

## 2023-11-12 NOTE — Progress Notes (Signed)
 Patient presents today to pick up custom molded foot orthotics, diagnosed with PF Right foot by Dr. Awanda.   Orthotics were dispensed and fit was satisfactory. Reviewed instructions for break-in and wear. Written instructions given to patient.  Patient will follow up as needed.   Lolita Schultze Cped, CFo,CFm

## 2023-11-16 ENCOUNTER — Other Ambulatory Visit: Payer: Self-pay | Admitting: Nurse Practitioner

## 2023-11-17 NOTE — Telephone Encounter (Signed)
 Requesting: Pregabalin  100 MG Oral Capsule  Last Visit: 10/06/2023 Next Visit: 01/05/2024 Last Refill: 08/08/2023  Please Advise

## 2023-11-28 ENCOUNTER — Ambulatory Visit

## 2023-11-28 ENCOUNTER — Ambulatory Visit
Admission: RE | Admit: 2023-11-28 | Discharge: 2023-11-28 | Disposition: A | Discharge status: Home / Self Care | Source: Ambulatory Visit | Attending: Nurse Practitioner | Admitting: Nurse Practitioner

## 2023-11-28 ENCOUNTER — Ambulatory Visit: Payer: Self-pay | Admitting: Nurse Practitioner

## 2023-11-28 DIAGNOSIS — R928 Other abnormal and inconclusive findings on diagnostic imaging of breast: Secondary | ICD-10-CM | POA: Diagnosis not present

## 2023-11-28 DIAGNOSIS — N6489 Other specified disorders of breast: Secondary | ICD-10-CM

## 2023-11-28 DIAGNOSIS — Z7689 Persons encountering health services in other specified circumstances: Secondary | ICD-10-CM | POA: Diagnosis not present

## 2023-12-16 ENCOUNTER — Other Ambulatory Visit: Payer: Self-pay | Admitting: Nurse Practitioner

## 2023-12-16 DIAGNOSIS — Z1231 Encounter for screening mammogram for malignant neoplasm of breast: Secondary | ICD-10-CM

## 2023-12-17 NOTE — Telephone Encounter (Signed)
 Requesting: Pregabalin  100 MG Oral Capsule  Last Visit: 10/06/2023 Next Visit: 01/05/2024 Last Refill: 11/17/2023  Please Advise

## 2024-01-05 ENCOUNTER — Ambulatory Visit: Admitting: Nurse Practitioner

## 2024-01-05 ENCOUNTER — Ambulatory Visit: Payer: Self-pay | Admitting: Nurse Practitioner

## 2024-01-05 ENCOUNTER — Encounter: Payer: Self-pay | Admitting: Nurse Practitioner

## 2024-01-05 ENCOUNTER — Ambulatory Visit: Payer: Self-pay

## 2024-01-05 VITALS — BP 128/84 | HR 109 | Temp 97.6°F | Ht 64.0 in | Wt 297.4 lb

## 2024-01-05 DIAGNOSIS — R911 Solitary pulmonary nodule: Secondary | ICD-10-CM

## 2024-01-05 DIAGNOSIS — Z7985 Long-term (current) use of injectable non-insulin antidiabetic drugs: Secondary | ICD-10-CM | POA: Diagnosis not present

## 2024-01-05 DIAGNOSIS — Z720 Tobacco use: Secondary | ICD-10-CM

## 2024-01-05 DIAGNOSIS — G8929 Other chronic pain: Secondary | ICD-10-CM

## 2024-01-05 DIAGNOSIS — M5441 Lumbago with sciatica, right side: Secondary | ICD-10-CM

## 2024-01-05 DIAGNOSIS — E119 Type 2 diabetes mellitus without complications: Secondary | ICD-10-CM | POA: Diagnosis not present

## 2024-01-05 LAB — MICROALBUMIN / CREATININE URINE RATIO
Creatinine,U: 52.9 mg/dL
Microalb Creat Ratio: UNDETERMINED mg/g (ref 0.0–30.0)
Microalb, Ur: <0.7 mg/dL

## 2024-01-05 MED ORDER — BUPROPION HCL ER (SR) 150 MG PO TB12: 150.0000 mg | ORAL_TABLET | Freq: Two times a day (BID) | ORAL | 0 refills | Status: AC

## 2024-01-05 MED ORDER — ROSUVASTATIN CALCIUM 5 MG PO TABS: 5.0000 mg | ORAL_TABLET | Freq: Every day | ORAL | 0 refills | Status: AC

## 2024-01-05 MED ORDER — FLUTICASONE-SALMETEROL 100-50 MCG/ACT IN AEPB: 1.0000 | INHALATION_SPRAY | Freq: Two times a day (BID) | RESPIRATORY_TRACT | 3 refills | Status: AC

## 2024-01-05 MED ORDER — TIZANIDINE HCL 4 MG PO TABS: 4.0000 mg | ORAL_TABLET | Freq: Three times a day (TID) | ORAL | 1 refills | Status: AC | PRN

## 2024-01-05 MED ORDER — OZEMPIC (0.25 OR 0.5 MG/DOSE) 2 MG/1.5ML ~~LOC~~ SOPN: 0.2500 mg | PEN_INJECTOR | SUBCUTANEOUS | 1 refills | Status: DC

## 2024-01-05 NOTE — Progress Notes (Signed)
 Established Patient Office Visit  Subjective   Patient ID: Sandra Stafford, female    DOB: [birth year]  Age: 63 y.o. MRN: [id number]  Chief Complaint  Patient presents with   Chronic midline low back pain with right-sided sciatica    Follow up and discuss medication management    HPI  Discussed the use of AI scribe software for clinical note transcription with the patient, who gave verbal consent to proceed.  History of Present Illness   Sandra Stafford is a 63 year old female with diabetes who presents for weight management and smoking cessation.  She has recently lost 17 pounds, with current weight around 295 to 297 pounds. She is cutting back on hard candy, bread, and cakes while increasing fruits and vegetables. She checks her blood sugar daily, with recent readings in the low 100s, including 105 this morning after a late meal of chili and cornbread. She notes occasional higher readings when she wakes up hungry and eats at night.  She uses Ventolin  every six hours and Advair twice a day for asthma but sometimes forgets Advair. Yesterday she had an asthma flare triggered by heat, with severe coughing, shortness of breath, and stomach cramps. She uses muscle relaxers for these cramps and needs a refill.  She has smoked for 50 years and currently smokes about a quarter pack every three to four days, down from one to two packs a day for 20 years in the past. She wants to quit because of breathlessness and a family cancer history. She has tried quitting before but was not fully committed.  She is trying to increase activity with raking leaves and more walking but develops muscle pain in her arms and legs. She has orthotics but has not used them with her exercise machine because of foot pain.       ROS See pertinent positives and negatives per HPI.    Objective:     BP 128/84 (BP Location: Left Arm, Patient Position: Sitting, Cuff Size: Large)   Pulse (!) 109   Temp 97.6 F (36.4 C)    Ht 5' 4 (1.626 m)   Wt 297 lb 6.4 oz (134.9 kg)   SpO2 97%   BMI 51.05 kg/m  BP Readings from Last 3 Encounters:  01/05/24 128/84  10/06/23 130/86  07/23/23 138/86   Wt Readings from Last 3 Encounters:  01/05/24 297 lb 6.4 oz (134.9 kg)  10/06/23 (!) 306 lb 6.4 oz (139 kg)  09/23/23 (!) 305 lb (138.3 kg)    Physical Exam Vitals and nursing note reviewed.  Constitutional:      General: She is not in acute distress.    Appearance: Normal appearance.  HENT:     Head: Normocephalic.  Eyes:     Conjunctiva/sclera: Conjunctivae normal.  Cardiovascular:     Rate and Rhythm: Normal rate and regular rhythm.     Pulses: Normal pulses.     Heart sounds: Normal heart sounds.  Pulmonary:     Effort: Pulmonary effort is normal.     Breath sounds: Wheezing present.  Musculoskeletal:     Cervical back: Normal range of motion.  Skin:    General: Skin is warm.  Neurological:     General: No focal deficit present.     Mental Status: She is alert and oriented to person, place, and time.  Psychiatric:        Mood and Affect: Mood normal.        Behavior: Behavior normal.  Thought Content: Thought content normal.        Judgment: Judgment normal.     The 10-year ASCVD risk score (Arnett DK, et al., 2019) is: 24.5%    Assessment & Plan:   Problem List Items Addressed This Visit       Endocrine   Diabetes mellitus without complication (HCC)   Chronic, stable. Blood sugars are well-controlled with occasional spikes, but current levels remain in the diabetic range. Initiated Ozempic  at 0.25 mg weekly for four weeks, then increase to 0.5 mg. Educated on Ozempic  side effects, advised hydration and Miralax if needed. Check urine microalbumin today. Recommend yearly eye exam. Start rosuvastatin  5mg  daily for cardiovascular protection. Follow-up in 2 months.       Relevant Medications   Semaglutide ,0.25 or 0.5MG /DOS, (OZEMPIC , 0.25 OR 0.5 MG/DOSE,) 2 MG/1.5ML SOPN    rosuvastatin  (CRESTOR ) 5 MG tablet   Other Relevant Orders   Microalbumin / creatinine urine ratio     Other   Morbid obesity (HCC)   BMI 51. She has lost 17 pounds with ongoing weight loss through dietary changes and exercise. Continue dietary modifications and exercise. Ozempic  initiated for weight management and diabetes.      Relevant Medications   Semaglutide ,0.25 or 0.5MG /DOS, (OZEMPIC , 0.25 OR 0.5 MG/DOSE,) 2 MG/1.5ML SOPN   Tobacco use   She smokes a quarter pack daily and is interested in quitting due to respiratory issues and family cancer history. Started Wellbutrin  SR 150mg : one tablet daily for three days, then twice daily. Ordered CT scan for lung cancer screening.      Relevant Orders   CT CHEST LUNG CANCER SCREENING LOW DOSE WO CONTRAST   Chronic midline low back pain - Primary   Chronic, ongoing. She is having ongoing low back pain, unable to stand for 15 minutes at a time. She has been taking lyrica  100mg  BID. MRI of lumbar spine approved, encouraged her to call and schedule imaging.       Relevant Medications   buPROPion  (WELLBUTRIN  SR) 150 MG 12 hr tablet   tiZANidine  (ZANAFLEX ) 4 MG tablet    Return in about 6 weeks (around 02/16/2024) for 6-8 weeks , Diabetes, smoking cessation.    Tinnie DELENA Harada, NP

## 2024-01-05 NOTE — Telephone Encounter (Signed)
 FYI Only or Action Required?: Action required by provider: pharmacy calling due to needing an adjustment in an order.  Patient was last seen in primary care on 01/05/2024 by Nedra Tinnie LABOR, NP.  Called Nurse Triage reporting Medication Problem.  Triage Disposition: Call PCP Now  Patient/caregiver understands and will follow disposition?: Yes  Copied from CRM [id number]. Topic: Clinical - Medication Question >> Jan 05, 2024  1:28 PM Franky GRADE wrote: Reason for CRM: Pharmacy is calling for clarification on Semaglutide ,0.25 or 0.5MG /DOS, (OZEMPIC , 0.25 OR 0.5 MG/DOSE,) 2 MG/1.5ML SOPN [[id number]] they received today, Patient is currently at the pharmacy. Reason for Disposition  [1] Pharmacy calling with prescription question AND [2] triager unable to answer question  Answer Assessment - Initial Assessment Questions [facility] pharmacy calling to requesting medication order to be for 3 ML syringes. Original order is 1.5 ML and the pharmacy doesn't have that. Requesting order be resent.  1. NAME of MEDICINE: What medicine(s) are you calling about?      Semaglutide ,0.25 or 0.5MG /DOS, (OZEMPIC , 0.25 OR 0.5 MG/DOSE,) 2 MG/1.5ML 2. QUESTION: What is your question? (e.g., double dose of medicine, side effect)     Pharmacy is calling stating the medication comes is 3ml syringes. Is needing a new prescription.  3. PRESCRIBER: Who prescribed the medicine? Reason: if prescribed by specialist, call should be referred to that group.     Tinnie Nedra NP 4. SYMPTOMS: Do you have any symptoms? If Yes, ask: What symptoms are you having?  How bad are the symptoms (e.g., mild, moderate, severe)     No  Protocols used: Medication Question Call-A-AH

## 2024-01-05 NOTE — Assessment & Plan Note (Signed)
 Chronic, stable. Blood sugars are well-controlled with occasional spikes, but current levels remain in the diabetic range. Initiated Ozempic  at 0.25 mg weekly for four weeks, then increase to 0.5 mg. Educated on Ozempic  side effects, advised hydration and Miralax if needed. Check urine microalbumin today. Recommend yearly eye exam. Start rosuvastatin  5mg  daily for cardiovascular protection. Follow-up in 2 months.

## 2024-01-05 NOTE — Assessment & Plan Note (Signed)
 Chronic, ongoing. She is having ongoing low back pain, unable to stand for 15 minutes at a time. She has been taking lyrica  100mg  BID. MRI of lumbar spine approved, encouraged her to call and schedule imaging.

## 2024-01-05 NOTE — Assessment & Plan Note (Signed)
 She smokes a quarter pack daily and is interested in quitting due to respiratory issues and family cancer history. Started Wellbutrin  SR 150mg : one tablet daily for three days, then twice daily. Ordered CT scan for lung cancer screening.

## 2024-01-05 NOTE — Assessment & Plan Note (Signed)
 BMI 51. She has lost 17 pounds with ongoing weight loss through dietary changes and exercise. Continue dietary modifications and exercise. Ozempic  initiated for weight management and diabetes.

## 2024-01-05 NOTE — Patient Instructions (Addendum)
 It was great to see you!  Call to schedule your MRI:  [phone]  I have ordered a CT to screen for lung cancer  Start wellbutrin  1 tablet daily for 3 days, then twice a day to help stop smoking  Start ozempic  injection 0.25mg  once a week for 4 weeks, then 0.5mg  weekly   I recommend getting a yearly eye exam and let them know you have diabetes  Make sure you are drinking plenty of fluids  Start rosuvastatin  once a day for your cholesterol   Let's follow-up in 6-8 weeks, sooner if you have concerns.  If a referral was placed today, you will be contacted for an appointment. Please note that routine referrals can sometimes take up to 3-4 weeks to process. Please call our office if you haven't heard anything after this time frame.  Take care,  Tinnie Harada, NP

## 2024-01-06 NOTE — Telephone Encounter (Signed)
 I called and left patient a message that the office aware of medication order and will call her back once completed.

## 2024-01-07 MED ORDER — OZEMPIC (0.25 OR 0.5 MG/DOSE) 2 MG/3ML ~~LOC~~ SOPN: PEN_INJECTOR | SUBCUTANEOUS | 0 refills | Status: DC

## 2024-01-07 NOTE — Telephone Encounter (Signed)
 I called patient and left a detailed message that medication was resent to pharmacy.

## 2024-01-07 NOTE — Addendum Note (Signed)
 Addended by: Jessah Danser A on: 01/07/2024 08:42 AM   Modules accepted: Orders

## 2024-01-09 ENCOUNTER — Other Ambulatory Visit: Payer: Self-pay | Admitting: Nurse Practitioner

## 2024-01-09 DIAGNOSIS — J301 Allergic rhinitis due to pollen: Secondary | ICD-10-CM

## 2024-01-09 DIAGNOSIS — Z419 Encounter for procedure for purposes other than remedying health state, unspecified: Secondary | ICD-10-CM | POA: Diagnosis not present

## 2024-01-09 NOTE — Telephone Encounter (Signed)
 Requesting: Fluticasone  Propionate 50 MCG/ACT Nasal Suspension  Last Visit: 01/05/2024 Next Visit: 02/18/2024 Last Refill: 07/23/2023  Please Advise

## 2024-01-14 ENCOUNTER — Inpatient Hospital Stay
Admission: RE | Admit: 2024-01-14 | Discharge: 2024-01-14 | Attending: Nurse Practitioner | Admitting: Nurse Practitioner

## 2024-01-14 DIAGNOSIS — F17210 Nicotine dependence, cigarettes, uncomplicated: Secondary | ICD-10-CM | POA: Diagnosis not present

## 2024-01-14 DIAGNOSIS — Z720 Tobacco use: Secondary | ICD-10-CM

## 2024-01-16 ENCOUNTER — Other Ambulatory Visit: Payer: Self-pay | Admitting: Nurse Practitioner

## 2024-01-19 ENCOUNTER — Telehealth: Payer: Self-pay

## 2024-01-19 ENCOUNTER — Other Ambulatory Visit (HOSPITAL_COMMUNITY): Payer: Self-pay

## 2024-01-19 NOTE — Telephone Encounter (Signed)
 Pharmacy Patient Advocate Encounter  Received notification from [facility] MEDICAID that Prior Authorization for Ozempic  2 has been APPROVED from 01/19/24 to 01/18/25. Ran test claim, Copay is $4.00. This test claim was processed through [facility]- copay amounts may vary at other pharmacies due to pharmacy/plan contracts, or as the patient moves through the different stages of their insurance plan.   PA #/Case ID/Reference #: # [id number]

## 2024-01-19 NOTE — Telephone Encounter (Signed)
 Pharmacy Patient Advocate Encounter   Received notification from Onbase that prior authorization for Ozempic  2 is required/requested.   Insurance verification completed.   The patient is insured through [facility] MEDICAID.   Per test claim: PA required; PA submitted to above mentioned insurance via Latent Key/confirmation #/EOC [id number] Status is pending

## 2024-01-19 NOTE — Telephone Encounter (Signed)
 Copied from CRM #[id number]. Topic: Clinical - Lab/Test Results >> Jan 19, 2024  3:00 PM Chasity T wrote: Reason for CRM: Diann from [facility] radiology is calling to confirm that the recent chest ct was viewable for PCP to see results.

## 2024-01-19 NOTE — Telephone Encounter (Signed)
 Requesting: Pregabalin  100 MG Oral Capsule  Last Visit: 01/05/2024 Next Visit: 02/18/2024 Last Refill: 12/17/2023  Please Advise

## 2024-01-19 NOTE — Telephone Encounter (Signed)
 Patient's CT Scan results are viewable.

## 2024-02-18 ENCOUNTER — Ambulatory Visit: Admitting: Nurse Practitioner

## 2024-02-18 ENCOUNTER — Ambulatory Visit: Payer: Self-pay | Admitting: Nurse Practitioner

## 2024-02-18 ENCOUNTER — Encounter: Payer: Self-pay | Admitting: Nurse Practitioner

## 2024-02-18 VITALS — BP 128/86 | HR 90 | Temp 97.1°F | Ht 64.0 in | Wt 292.8 lb

## 2024-02-18 DIAGNOSIS — Z7985 Long-term (current) use of injectable non-insulin antidiabetic drugs: Secondary | ICD-10-CM | POA: Diagnosis not present

## 2024-02-18 DIAGNOSIS — E119 Type 2 diabetes mellitus without complications: Secondary | ICD-10-CM | POA: Diagnosis not present

## 2024-02-18 DIAGNOSIS — M25551 Pain in right hip: Secondary | ICD-10-CM | POA: Diagnosis not present

## 2024-02-18 DIAGNOSIS — R350 Frequency of micturition: Secondary | ICD-10-CM | POA: Diagnosis not present

## 2024-02-18 DIAGNOSIS — Z6841 Body Mass Index (BMI) 40.0 and over, adult: Secondary | ICD-10-CM

## 2024-02-18 DIAGNOSIS — Z720 Tobacco use: Secondary | ICD-10-CM

## 2024-02-18 DIAGNOSIS — R911 Solitary pulmonary nodule: Secondary | ICD-10-CM | POA: Diagnosis not present

## 2024-02-18 DIAGNOSIS — J4521 Mild intermittent asthma with (acute) exacerbation: Secondary | ICD-10-CM | POA: Diagnosis not present

## 2024-02-18 DIAGNOSIS — J301 Allergic rhinitis due to pollen: Secondary | ICD-10-CM | POA: Diagnosis not present

## 2024-02-18 DIAGNOSIS — R3915 Urgency of urination: Secondary | ICD-10-CM

## 2024-02-18 LAB — POCT URINALYSIS DIPSTICK
Bilirubin, UA: NEGATIVE
Blood, UA: NEGATIVE
Glucose, UA: NEGATIVE
Ketones, UA: NEGATIVE
Leukocytes, UA: NEGATIVE
Nitrite, UA: NEGATIVE
Protein, UA: NEGATIVE
Spec Grav, UA: 1.01
Urobilinogen, UA: 0.2 U/dL
pH, UA: 6.5

## 2024-02-18 MED ORDER — PREDNISONE 20 MG PO TABS: 40.0000 mg | ORAL_TABLET | Freq: Every day | ORAL | 0 refills | Status: AC

## 2024-02-18 MED ORDER — OZEMPIC (0.25 OR 0.5 MG/DOSE) 2 MG/3ML ~~LOC~~ SOPN: PEN_INJECTOR | SUBCUTANEOUS | 0 refills | Status: AC

## 2024-02-18 MED ORDER — ALBUTEROL SULFATE HFA 108 (90 BASE) MCG/ACT IN AERS: 1.0000 | INHALATION_SPRAY | Freq: Four times a day (QID) | RESPIRATORY_TRACT | 2 refills | Status: AC | PRN

## 2024-02-18 MED ORDER — FLUTICASONE PROPIONATE 50 MCG/ACT NA SUSP: NASAL | 1 refills | Status: AC

## 2024-02-18 NOTE — Progress Notes (Signed)
 "   Established Patient Office Visit Subjective:    Patient ID: Sandra Stafford, female    DOB: December 01, 1960, 64 y.o.   MRN: [id number]  Chief Complaint Chief Complaint  Patient presents with   Diabetes    Follow up, concerns with right side pain for 1 week, Rx refill     HPI  She was started on Ozempic  0.5mg  weekly for weight management for improved diabetes control. She notes that she did not start the ozempic  yet.   Sandra Stafford has a history of smoker (0.25 PPD) and was recently strated on Wellbutrin  150 mg for cessation assistance. Her chest CT noted a suspicious nodule in the peripheral left upper lobe (solid, irregular measuring 10.7 mm) and medial right upper lobe nodule (solid 7.2 mm) - new compared to 2011.  She reports intense stabbing side pain for the past week. The pain is constant with fluctuating intensity and worsens with movement, especially twisting. She denies recent injury, constipation, and ongoing nausea, though she had a single episode of nausea last week. She is taking Tylenol  and prescribed lyrica .   Over the past few days she has had markedly increased urinary frequency, up to about ten times per day, with a sensation of incomplete bladder emptying and needing to strain to void fully. Nocturia has worsened from every two hours to every thirty minutes. She notes this frequency is increased even without higher fluid intake. She denies dysuria.  She has chronic respiratory issues with shortness of breath that feels typical for her. She ran out of her albuterol  inhaler but is still using Advair. She has had cough with mostly clear phlegm for a couple of weeks, occasionally yellow, for which she uses Mucinex  or similar mucus relief.  She takes Wellbutrin  twice daily, which has helped her reduce smoking to about two cigarettes per day. She denies side effects.      Review of Systems See pertinent positives and negatives per HPI.     Objective:    BP 128/86 (BP Location:  Left Arm, Patient Position: Sitting, Cuff Size: Normal)   Pulse 90   Temp (!) 97.1 F (36.2 C)   Ht 5' 4 (1.626 m)   Wt 292 lb 12.8 oz (132.8 kg)   SpO2 96%   BMI 50.26 kg/m   BP Readings from Last 3 Encounters:  02/18/24 128/86  01/05/24 128/84  10/06/23 130/86   Wt Readings from Last 3 Encounters:  02/18/24 292 lb 12.8 oz (132.8 kg)  01/05/24 297 lb 6.4 oz (134.9 kg)  10/06/23 (!) 306 lb 6.4 oz (139 kg)   Physical Exam Vitals and nursing note reviewed.  Constitutional:      General: She is not in acute distress.    Appearance: Normal appearance.  HENT:     Head: Normocephalic.  Eyes:     Conjunctiva/sclera: Conjunctivae normal.  Cardiovascular:     Rate and Rhythm: Normal rate and regular rhythm.     Pulses: Normal pulses.     Heart sounds: Normal heart sounds.  Pulmonary:     Effort: Pulmonary effort is normal.     Breath sounds: Wheezing present.  Musculoskeletal:        General: No tenderness.     Cervical back: Normal range of motion.     Comments: Right rib/upper hip pain with bending and twisting  Skin:    General: Skin is warm.  Neurological:     General: No focal deficit present.     Mental Status:  She is alert and oriented to person, place, and time.  Psychiatric:        Mood and Affect: Mood normal.        Behavior: Behavior normal.        Thought Content: Thought content normal.        Judgment: Judgment normal.     Results for orders placed or performed in visit on 02/18/24  POCT urinalysis dipstick  Result Value Ref Range   Color, UA     Clarity, UA     Glucose, UA Negative Negative   Bilirubin, UA Negative    Ketones, UA Negative    Spec Grav, UA 1.010 1.010 - 1.025   Blood, UA Negative    pH, UA 6.5 5.0 - 8.0   Protein, UA Negative Negative   Urobilinogen, UA 0.2 0.2 or 1.0 E.U./dL   Nitrite, UA Negative    Leukocytes, UA Negative Negative   Appearance     Odor       The 10-year ASCVD risk score (Arnett DK, et al., 2019) is:  26.8%      Assessment & Plan:   Problem List Items Addressed This Visit       Respiratory   Seasonal allergic rhinitis due to pollen   Continue flonase  nasal spray daily.       Relevant Medications   fluticasone  (FLONASE ) 50 MCG/ACT nasal spray   Reactive airway disease   She experiences acute exacerbation with wheezing and increased phlegm, having run out of albuterol . She continues Advair and finds over-the-counter remedies helpful for phlegm. Prescribed an albuterol  inhaler, continue Advair, and started prednisone  40mg  once a day with food.      Relevant Medications   albuterol  (VENTOLIN  HFA) 108 (90 Base) MCG/ACT inhaler     Endocrine   Diabetes mellitus without complication (HCC) - Primary   Chronic, stable. Blood sugars are well-controlled with occasional spikes, but current levels remain in the diabetic range. She did not start ozempic  yet, will have her start Ozempic  at 0.25 mg weekly for four weeks, then increase to 0.5 mg. Educated on Ozempic  side effects, advised hydration and Miralax if needed. Check BMP, A1c today.  Recommend yearly eye exam. Follow-up in 2 months.       Relevant Medications   Semaglutide ,0.25 or 0.5MG /DOS, (OZEMPIC , 0.25 OR 0.5 MG/DOSE,) 2 MG/3ML SOPN   Other Relevant Orders   Basic metabolic panel with GFR   Hemoglobin A1c     Other   Morbid obesity (HCC)   BMI 50.2. She has lost 22 pounds with ongoing weight loss through dietary changes and exercise. Continue dietary modifications and exercise. Ozempic  initiated for weight management and diabetes.      Relevant Medications   Semaglutide ,0.25 or 0.5MG /DOS, (OZEMPIC , 0.25 OR 0.5 MG/DOSE,) 2 MG/3ML SOPN   Tobacco use   She currently smokes two cigarettes a day. Wellbutrin  is effective in reducing smoking, and she desires to quit completely. Continue Wellbutrin .       Other Visit Diagnoses       Urine frequency       Increased frequency and sensation of incomplete emptying. Discussed and  advised the double voiding technique. U/A negative. Will place referral to urology.   Relevant Orders   POCT urinalysis dipstick (Completed)     Lung nodule       she has appointment with pulmonology on 02/23/24, encouraged her to keep this appointment     Right hip pain  Right hip/lower side pain. Continue tylenol  as needed and encourage regular stretching.       Tinnie DELENA Harada, NP  I,Emily Lagle,acting as a scribe for [employer], NP.,have documented all relevant documentation on the behalf of Dona Walby DELENA Harada, NP.  I, Tinnie DELENA Harada, NP, have reviewed all documentation for this visit. The documentation on 02/18/2024 for the exam, diagnosis, procedures, and orders are all accurate and complete. "

## 2024-02-18 NOTE — Assessment & Plan Note (Addendum)
 BMI 50.2. She has lost 22 pounds with ongoing weight loss through dietary changes and exercise. Continue dietary modifications and exercise. Ozempic  initiated for weight management and diabetes.

## 2024-02-18 NOTE — Assessment & Plan Note (Signed)
 Chronic, stable. Blood sugars are well-controlled with occasional spikes, but current levels remain in the diabetic range. She did not start ozempic  yet, will have her start Ozempic  at 0.25 mg weekly for four weeks, then increase to 0.5 mg. Educated on Ozempic  side effects, advised hydration and Miralax if needed. Check BMP, A1c today.  Recommend yearly eye exam. Follow-up in 2 months.

## 2024-02-18 NOTE — Assessment & Plan Note (Signed)
 She experiences acute exacerbation with wheezing and increased phlegm, having run out of albuterol . She continues Advair and finds over-the-counter remedies helpful for phlegm. Prescribed an albuterol  inhaler, continue Advair, and started prednisone  40mg  once a day with food.

## 2024-02-18 NOTE — Patient Instructions (Signed)
 It was great to see you!  Start ozempic  0.25mg  injection once a week for 4 weeks, then increase to 0.5mg  weekly   Start prednisone  2 tablets once a day with food  We will check for a urine infection today   Let's follow-up in 2 months, sooner if you have concerns.  If a referral was placed today, you will be contacted for an appointment. Please note that routine referrals can sometimes take up to 3-4 weeks to process. Please call our office if you haven't heard anything after this time frame.  Take care,  Tinnie Harada, NP

## 2024-02-18 NOTE — Assessment & Plan Note (Addendum)
 She currently smokes two cigarettes a day. Wellbutrin  is effective in reducing smoking, and she desires to quit completely. Continue Wellbutrin .

## 2024-02-18 NOTE — Assessment & Plan Note (Signed)
Continue flonase nasal spray daily

## 2024-02-23 ENCOUNTER — Ambulatory Visit

## 2024-03-02 ENCOUNTER — Telehealth: Payer: Self-pay

## 2024-03-02 NOTE — Telephone Encounter (Signed)
-----   Message from Tinnie Harada, NP sent at 03/01/2024  7:52 AM EST ----- Did Sandra Stafford get her labs drawn from 02/18/24?

## 2024-03-02 NOTE — Telephone Encounter (Signed)
 Left message for patient to return call.

## 2024-03-02 NOTE — Telephone Encounter (Signed)
 Patient returned call and she did not get labs drawn at last appointment. I notified patient that labs can be drawn at next appointment.

## 2024-03-05 ENCOUNTER — Ambulatory Visit

## 2024-03-22 ENCOUNTER — Ambulatory Visit: Admitting: Urology

## 2024-03-25 ENCOUNTER — Ambulatory Visit

## 2024-04-14 ENCOUNTER — Ambulatory Visit: Admitting: Nurse Practitioner
